# Patient Record
Sex: Female | Born: 1995 | Race: Black or African American | Hispanic: No | Marital: Single | Smoking: Former smoker
Health system: Southern US, Community
[De-identification: ages and names within clinical notes are randomized; demographics above are authoritative.]

## PROBLEM LIST (undated history)

## (undated) DIAGNOSIS — F32A Depression, unspecified: Secondary | ICD-10-CM

## (undated) DIAGNOSIS — R569 Unspecified convulsions: Secondary | ICD-10-CM

## (undated) DIAGNOSIS — F419 Anxiety disorder, unspecified: Secondary | ICD-10-CM

## (undated) HISTORY — DX: Unspecified convulsions: R56.9

## (undated) HISTORY — DX: Depression, unspecified: F32.A

## (undated) HISTORY — DX: Anxiety disorder, unspecified: F41.9

---
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
## 2016-08-24 ENCOUNTER — Emergency Department
Admission: EM | Admit: 2016-08-24 | Discharge: 2016-08-24 | Disposition: A | Discharge status: Home / Self Care | Payer: Self-pay | Attending: Emergency Medicine | Admitting: Emergency Medicine

## 2016-08-24 ENCOUNTER — Encounter: Payer: Self-pay | Admitting: Emergency Medicine

## 2016-08-24 DIAGNOSIS — J0100 Acute maxillary sinusitis, unspecified: Secondary | ICD-10-CM | POA: Insufficient documentation

## 2016-08-24 DIAGNOSIS — Z87891 Personal history of nicotine dependence: Secondary | ICD-10-CM | POA: Insufficient documentation

## 2016-08-24 MED ORDER — AMOXICILLIN 500 MG PO CAPS: 500.0000 mg | ORAL_CAPSULE | Freq: Three times a day (TID) | ORAL | 0 refills | Status: DC

## 2016-08-24 MED ORDER — FEXOFENADINE-PSEUDOEPHED ER 60-120 MG PO TB12: 1.0000 | ORAL_TABLET | Freq: Two times a day (BID) | ORAL | 0 refills | Status: DC

## 2016-08-24 NOTE — ED Notes (Signed)
Pt states that for the past 2 weeks she has had facial pain, blurred vision, headache, sinus pressure, and nasal congestion.

## 2016-08-24 NOTE — ED Notes (Signed)

## 2016-08-24 NOTE — ED Provider Notes (Signed)
[facility] Emergency Department Provider Note   ____________________________________________   First MD Initiated Contact with Patient 08/24/16 1637     (approximate)  I have reviewed the triage vital signs and the nursing notes.   HISTORY  Chief Complaint Facial Pain    HPI Julia Sullivan is a 21 y.o. female patient complains sinus congestion with drainage 2 weeks. Patient also states she's having facial pain, headache, and left ear pressure patient state 2 weeks ago she was diagnosed with the flu. No palliative measures for this complaint. Patient rates her pain discomfort as a 4/10. Patient described the pain as "pressure".   History reviewed. No pertinent past medical history.  There are no active problems to display for this patient.   History reviewed. No pertinent surgical history.  Prior to Admission medications   Medication Sig Start Date End Date Taking? Authorizing Provider  amoxicillin (AMOXIL) 500 MG capsule Take 1 capsule (500 mg total) by mouth 3 (three) times daily. 08/24/16   Joni Reining, PA-C  fexofenadine-pseudoephedrine (ALLEGRA-D) 60-120 MG 12 hr tablet Take 1 tablet by mouth 2 (two) times daily. 08/24/16   Joni Reining, PA-C    Allergies Patient has no known allergies.  No family history on file.  Social History Social History  Substance Use Topics  . Smoking status: Former Games developer  . Smokeless tobacco: Not on file  . Alcohol use Not on file    Review of Systems Constitutional: No fever/chills Eyes: No visual changes. ENT: No sore throat.Nasal congestion and ear Cardiovascular: Denies chest pain. Respiratory: Denies shortness of breath. Gastrointestinal: No abdominal pain.  No nausea, no vomiting.  No diarrhea.  No constipation. Genitourinary: Negative for dysuria. Musculoskeletal: Negative for back pain. Skin: Negative for rash. Neurological: Negative for headaches, focal weakness or  numbness.    ____________________________________________   PHYSICAL EXAM:  VITAL SIGNS: ED Triage Vitals  Enc Vitals Group     BP 08/24/16 1631 128/78     Pulse Rate 08/24/16 1631 62     Resp 08/24/16 1631 20     Temp 08/24/16 1631 97.9 F (36.6 C)     Temp Source 08/24/16 1631 Oral     SpO2 08/24/16 1631 100 %     Weight 08/24/16 1631 294 lb (133.4 kg)     Height 08/24/16 1631 5\' 6"  (1.676 m)     Head Circumference --      Peak Flow --      Pain Score 08/24/16 1632 4     Pain Loc --      Pain Edu? --      Excl. in GC? --     Constitutional: Alert and oriented. Well appearing and in no acute distress. Eyes: Conjunctivae are normal. PERRL. EOMI. Head: Atraumatic. Nose:Edematous nasal turbinates. Bilateral maxillary guarding.  Mouth/Throat: Mucous membranes are moist.  Oropharynx non-erythematous. Postnasal drainage. Neck: No stridor. No cervical spine tenderness to palpation. Hematological/Lymphatic/Immunilogical: No cervical lymphadenopathy. Cardiovascular: Normal rate, regular rhythm. Grossly normal heart sounds.  Good peripheral circulation. Respiratory: Normal respiratory effort.  No retractions. Lungs CTAB. Gastrointestinal: Soft and nontender. No distention. No abdominal bruits. No CVA tenderness. Musculoskeletal: No lower extremity tenderness nor edema.  No joint effusions. Neurologic:  Normal speech and language. No gross focal neurologic deficits are appreciated. No gait instability. Skin:  Skin is warm, dry and intact. No rash noted. Psychiatric: Mood and affect are normal. Speech and behavior are normal.  ____________________________________________   LABS (all labs ordered are  listed, but only abnormal results are displayed)  Labs Reviewed - No data to display ____________________________________________  EKG   ____________________________________________  RADIOLOGY   ____________________________________________   PROCEDURES  Procedure(s)  performed: None  Procedures  Critical Care performed: No  ____________________________________________   INITIAL IMPRESSION / ASSESSMENT AND PLAN / ED COURSE  Pertinent labs & imaging results that were available during my care of the patient were reviewed by me and considered in my medical decision making (see chart for details).  Maxillary sinusitis. Patient given discharge care instructions. Patient get a prescription for amoxicillin and fexofenadine pseudoephedrine. Patient advised follow "clinic if condition persists.      ____________________________________________   FINAL CLINICAL IMPRESSION(S) / ED DIAGNOSES  Final diagnoses:  Subacute maxillary sinusitis      NEW MEDICATIONS STARTED DURING THIS VISIT:  New Prescriptions   AMOXICILLIN (AMOXIL) 500 MG CAPSULE    Take 1 capsule (500 mg total) by mouth 3 (three) times daily.   FEXOFENADINE-PSEUDOEPHEDRINE (ALLEGRA-D) 60-120 MG 12 HR TABLET    Take 1 tablet by mouth 2 (two) times daily.     Note:  This document was prepared using Dragon voice recognition software and may include unintentional dictation errors.    Joni Reiningonald K Smith, PA-C 08/24/16 1651    Governor Rooksebecca Lord, MD 08/25/16 [phone]

## 2016-08-24 NOTE — ED Triage Notes (Signed)
Sinus congestion with clear drainage x 2 weeks.

## 2016-11-06 ENCOUNTER — Encounter: Payer: Self-pay | Admitting: Certified Nurse Midwife

## 2016-11-16 ENCOUNTER — Encounter: Payer: Self-pay | Admitting: Emergency Medicine

## 2016-11-16 ENCOUNTER — Emergency Department: Payer: Self-pay

## 2016-11-16 DIAGNOSIS — R0789 Other chest pain: Secondary | ICD-10-CM | POA: Insufficient documentation

## 2016-11-16 DIAGNOSIS — Z79899 Other long term (current) drug therapy: Secondary | ICD-10-CM | POA: Insufficient documentation

## 2016-11-16 DIAGNOSIS — K219 Gastro-esophageal reflux disease without esophagitis: Secondary | ICD-10-CM | POA: Insufficient documentation

## 2016-11-16 DIAGNOSIS — Z87891 Personal history of nicotine dependence: Secondary | ICD-10-CM | POA: Insufficient documentation

## 2016-11-16 LAB — TROPONIN I: Troponin I: <0.03 ng/mL (ref <0.03)

## 2016-11-16 LAB — CBC
HEMATOCRIT: 33.6 % — AB (ref 35.0–47.0)
HEMOGLOBIN: 11.6 g/dL — AB (ref 12.0–16.0)
MCH: 32.5 pg (ref 26.0–34.0)
MCHC: 34.6 g/dL (ref 32.0–36.0)
MCV: 94 fL (ref 80.0–100.0)
Platelets: 240 10*3/uL (ref 150–440)
RBC: 3.58 MIL/uL — ABNORMAL LOW (ref 3.80–5.20)
RDW: 13.1 % (ref 11.5–14.5)
WBC: 6.5 10*3/uL (ref 3.6–11.0)

## 2016-11-16 LAB — BASIC METABOLIC PANEL
ANION GAP: 7 (ref 5–15)
BUN: 11 mg/dL (ref 6–20)
CHLORIDE: 105 mmol/L (ref 101–111)
CO2: 25 mmol/L (ref 22–32)
Calcium: 8.8 mg/dL — ABNORMAL LOW (ref 8.9–10.3)
Creatinine, Ser: 0.86 mg/dL (ref 0.44–1.00)
GFR calc Af Amer: >60 mL/min (ref >60)
GFR calc non Af Amer: >60 mL/min (ref >60)
GLUCOSE: 90 mg/dL (ref 65–99)
POTASSIUM: 3.9 mmol/L (ref 3.5–5.1)
Sodium: 137 mmol/L (ref 135–145)

## 2016-11-16 NOTE — ED Triage Notes (Signed)
Pt ambulatory to triage in NAD, report upper CP x several days, reports palpitations w/ pain, denies other sx.  Denies cardiac hx or hx of risk factors.

## 2016-11-17 ENCOUNTER — Emergency Department
Admission: EM | Admit: 2016-11-17 | Discharge: 2016-11-17 | Disposition: A | Discharge status: Home / Self Care | Payer: Self-pay | Attending: Emergency Medicine | Admitting: Emergency Medicine

## 2016-11-17 DIAGNOSIS — R0789 Other chest pain: Secondary | ICD-10-CM

## 2016-11-17 DIAGNOSIS — K219 Gastro-esophageal reflux disease without esophagitis: Secondary | ICD-10-CM

## 2016-11-17 MED ORDER — FAMOTIDINE 40 MG PO TABS: 40.0000 mg | ORAL_TABLET | Freq: Every evening | ORAL | 0 refills | Status: DC

## 2016-11-17 NOTE — Discharge Instructions (Signed)
Please take your Pepcid every day as prescribed and follow up with her primary care physician within one to 2 weeks for recheck. Return to the emergency department sooner for any new or worsening symptoms such as worsening pain, if you cannot eat or drink, or for any other concerns whatsoever.  It was a pleasure to take care of you today, and thank you for coming to our emergency department.  If you have any questions or concerns before leaving please ask the nurse to grab me and I'm more than happy to go through your aftercare instructions again.  If you were prescribed any opioid pain medication today such as Norco, Vicodin, Percocet, morphine, hydrocodone, or oxycodone please make sure you do not drive when you are taking this medication as it can alter your ability to drive safely.  If you have any concerns once you are home that you are not improving or are in fact getting worse before you can make it to your follow-up appointment, please do not hesitate to call [phone] and come back for further evaluation.  Merrily BrittleNeil Memphis Creswell MD  Results for orders placed or performed during the hospital encounter of 11/17/16  Basic metabolic panel  Result Value Ref Range   Sodium 137 135 - 145 mmol/L   Potassium 3.9 3.5 - 5.1 mmol/L   Chloride 105 101 - 111 mmol/L   CO2 25 22 - 32 mmol/L   Glucose, Bld 90 65 - 99 mg/dL   BUN 11 6 - 20 mg/dL   Creatinine, Ser 4.090.86 0.44 - 1.00 mg/dL   Calcium 8.8 (L) 8.9 - 10.3 mg/dL   GFR calc non Af Amer >60 >60 mL/min   GFR calc Af Amer >60 >60 mL/min   Anion gap 7 5 - 15  CBC  Result Value Ref Range   WBC 6.5 3.6 - 11.0 K/uL   RBC 3.58 (L) 3.80 - 5.20 MIL/uL   Hemoglobin 11.6 (L) 12.0 - 16.0 g/dL   HCT 81.133.6 (L) 91.435.0 - 78.247.0 %   MCV 94.0 80.0 - 100.0 fL   MCH 32.5 26.0 - 34.0 pg   MCHC 34.6 32.0 - 36.0 g/dL   RDW 95.613.1 21.311.5 - 08.614.5 %   Platelets 240 150 - 440 K/uL  Troponin I  Result Value Ref Range   Troponin I <0.03 <0.03 ng/mL   Dg Chest 2 View  Result Date:  11/16/2016 CLINICAL DATA:  21 y/o  F; chest pain. EXAM: CHEST  2 VIEW COMPARISON:  None. FINDINGS: The heart size and mediastinal contours are within normal limits. Both lungs are clear. The visualized skeletal structures are unremarkable. IMPRESSION: No active cardiopulmonary disease. Electronically Signed   By: Mitzi HansenLance  Furusawa-Stratton M.D.   On: 11/16/2016 23:31

## 2016-11-17 NOTE — ED Provider Notes (Signed)
[facility] Emergency Department Provider Note  ____________________________________________   First MD Initiated Contact with Patient 11/17/16 0154     (approximate)  I have reviewed the triage vital signs and the nursing notes.   HISTORY  Chief Complaint Chest Pain    HPI Julia Sullivan is a 21 y.o. female who comes to the emergency department with 5 days of intermittent chest pain. The pain is described as slight burning and brief. It is worse in the morning and worse after eating. It is not exertional. It is not associated with shortness of breath. It is diffuse across her upper chest. She has no past medical history. No family history of early heart disease or sudden cardiac death. She denies abdominal pain. She does report nausea but denies vomiting.   History reviewed. No pertinent past medical history.  There are no active problems to display for this patient.   History reviewed. No pertinent surgical history.  Prior to Admission medications   Medication Sig Start Date End Date Taking? Authorizing Provider  amoxicillin (AMOXIL) 500 MG capsule Take 1 capsule (500 mg total) by mouth 3 (three) times daily. 08/24/16   Joni ReiningSmith, Ronald K, PA-C  famotidine (PEPCID) 40 MG tablet Take 1 tablet (40 mg total) by mouth every evening. 11/17/16 11/17/17  Merrily Brittleifenbark, Meredyth Hornung, MD  fexofenadine-pseudoephedrine (ALLEGRA-D) 60-120 MG 12 hr tablet Take 1 tablet by mouth 2 (two) times daily. 08/24/16   Joni ReiningSmith, Ronald K, PA-C    Allergies Patient has no known allergies.  History reviewed. No pertinent family history.  Social History Social History  Substance Use Topics  . Smoking status: Former Games developermoker  . Smokeless tobacco: Never Used  . Alcohol use Yes    Review of Systems Constitutional: No fever/chills Eyes: No visual changes. ENT: No sore throat. Cardiovascular: Positive chest pain. Respiratory: Denies shortness of breath. Gastrointestinal: No abdominal  pain.  Positive nausea, no vomiting.  No diarrhea.  No constipation. Genitourinary: Negative for dysuria. Musculoskeletal: Negative for back pain. Skin: Negative for rash. Neurological: Negative for headaches, focal weakness or numbness.  10-point ROS otherwise negative.  ____________________________________________   PHYSICAL EXAM:  VITAL SIGNS: ED Triage Vitals  Enc Vitals Group     BP 11/16/16 2241 (!) 153/90     Pulse Rate 11/16/16 2241 79     Resp 11/16/16 2241 16     Temp 11/16/16 2241 98.5 F (36.9 C)     Temp Source 11/16/16 2241 Oral     SpO2 11/16/16 2241 100 %     Weight 11/16/16 2242 297 lb (134.7 kg)     Height 11/16/16 2242 5\' 6"  (1.676 m)     Head Circumference --      Peak Flow --      Pain Score 11/16/16 2241 3     Pain Loc --      Pain Edu? --      Excl. in GC? --     Constitutional: Alert and oriented x 4 well appearing nontoxic no diaphoresis speaks in full, clear sentences Eyes: PERRL EOMI. Head: Atraumatic. Nose: No congestion/rhinnorhea. Mouth/Throat: No trismus Neck: No stridor.   Cardiovascular: Normal rate, regular rhythm. Grossly normal heart sounds.  Good peripheral circulation. Respiratory: Normal respiratory effort.  No retractions. Lungs CTAB and moving good air Gastrointestinal: Soft obese nontender no rebound no guarding no peritonitis Musculoskeletal: No lower extremity edema   Neurologic:  Normal speech and language. No gross focal neurologic deficits are appreciated. Skin:  Skin is warm,  dry and intact. No rash noted. Psychiatric: Mood and affect are normal. Speech and behavior are normal.    ____________________________________________   ______________________________________   LABS (all labs ordered are listed, but only abnormal results are displayed)  Labs Reviewed  BASIC METABOLIC PANEL - Abnormal; Notable for the following:       Result Value   Calcium 8.8 (*)    All other components within normal limits  CBC -  Abnormal; Notable for the following:    RBC 3.58 (*)    Hemoglobin 11.6 (*)    HCT 33.6 (*)    All other components within normal limits  TROPONIN I    No evidence of acute ischemia __________________________________________  EKG  ED ECG REPORT I, Merrily Brittle, the attending physician, personally viewed and interpreted this ECG.  Date: 11/17/2016 Rate: 74 Rhythm: normal sinus rhythm QRS Axis: normal Intervals: normal ST/T Wave abnormalities: normal Conduction Disturbances: none Narrative Interpretation: unremarkable  ____________________________________________  RADIOLOGY  Chest x-ray with no acute disease ____________________________________________   PROCEDURES  Procedure(s) performed: no  Procedures  Critical Care performed: no  Observation: no ____________________________________________   INITIAL IMPRESSION / ASSESSMENT AND PLAN / ED COURSE  Pertinent labs & imaging results that were available during my care of the patient were reviewed by me and considered in my medical decision making (see chart for details).  Postprandial nonexertional chest pain and a 21 year old woman is likely noncardiac. Doubt pulmonary embolism as she is PERC negative.  I will give a trial of Pepcid and the patient says that she has a primary care physician she can follow-up with in 2 weeks for recheck. Strict return to the ER precautions given. She is discharged home in good condition.      ____________________________________________   FINAL CLINICAL IMPRESSION(S) / ED DIAGNOSES  Final diagnoses:  Atypical chest pain  Gastroesophageal reflux disease without esophagitis      NEW MEDICATIONS STARTED DURING THIS VISIT:  New Prescriptions   FAMOTIDINE (PEPCID) 40 MG TABLET    Take 1 tablet (40 mg total) by mouth every evening.     Note:  This document was prepared using Dragon voice recognition software and may include unintentional dictation errors.       Merrily Brittle, MD 11/17/16 [phone]

## 2016-12-12 ENCOUNTER — Encounter: Payer: Self-pay | Admitting: Certified Nurse Midwife

## 2018-07-01 IMAGING — CR DG CHEST 2V
3 series · 3 of 3 positions shown · non-contrast
Comparison: None.

CLINICAL DATA: 21 y/o  F; chest pain.

EXAM:
CHEST  2 VIEW

[chest pa (1 of 2)]
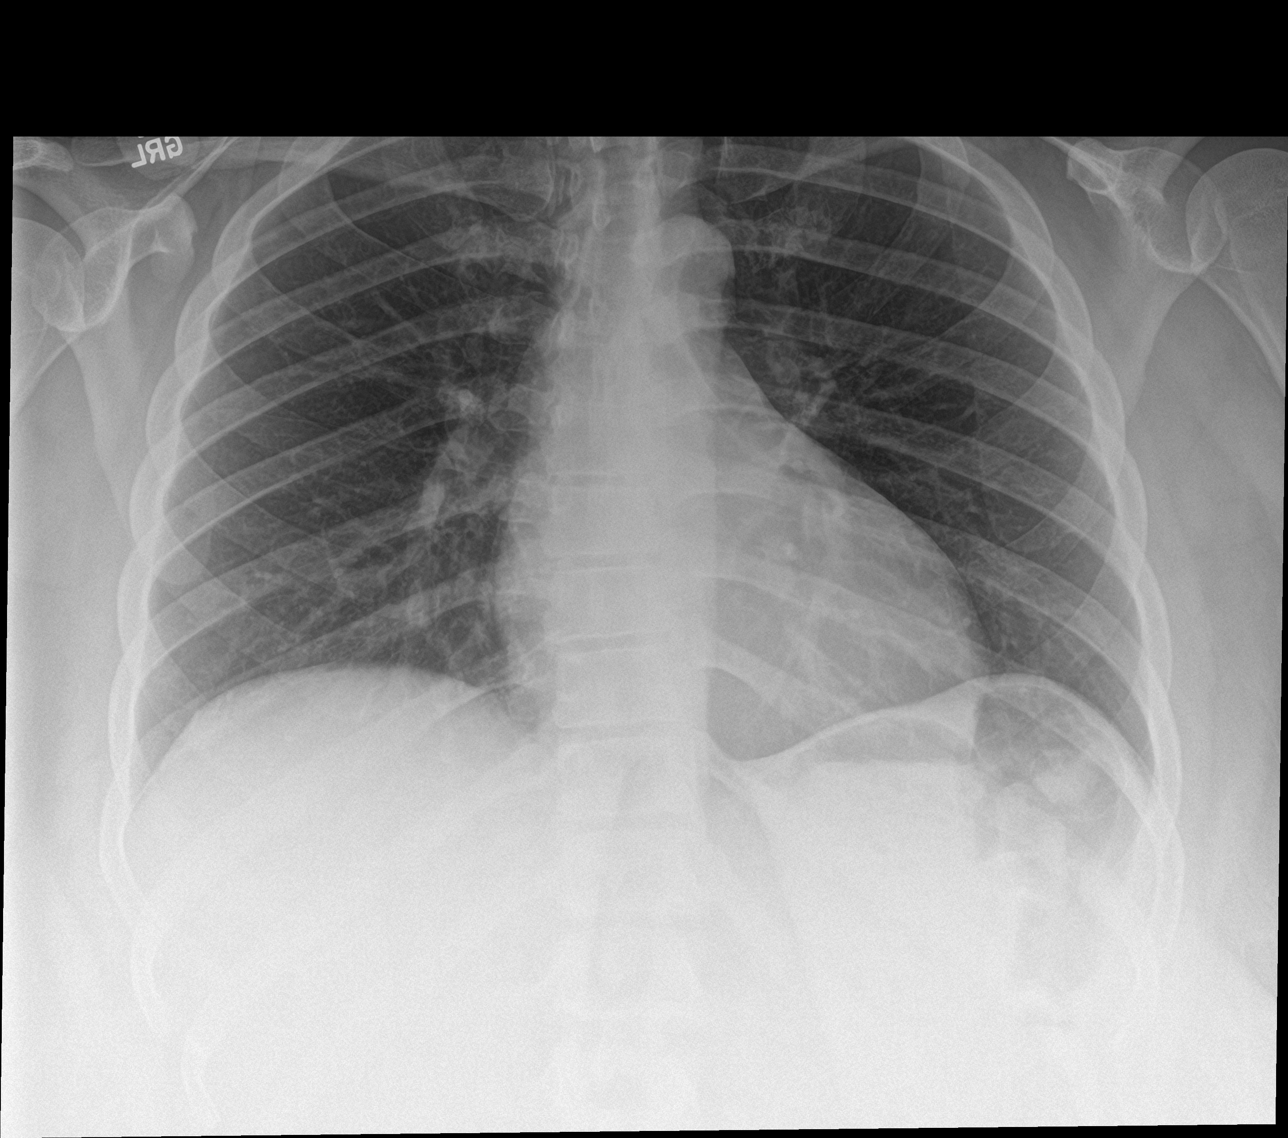

[chest lat]
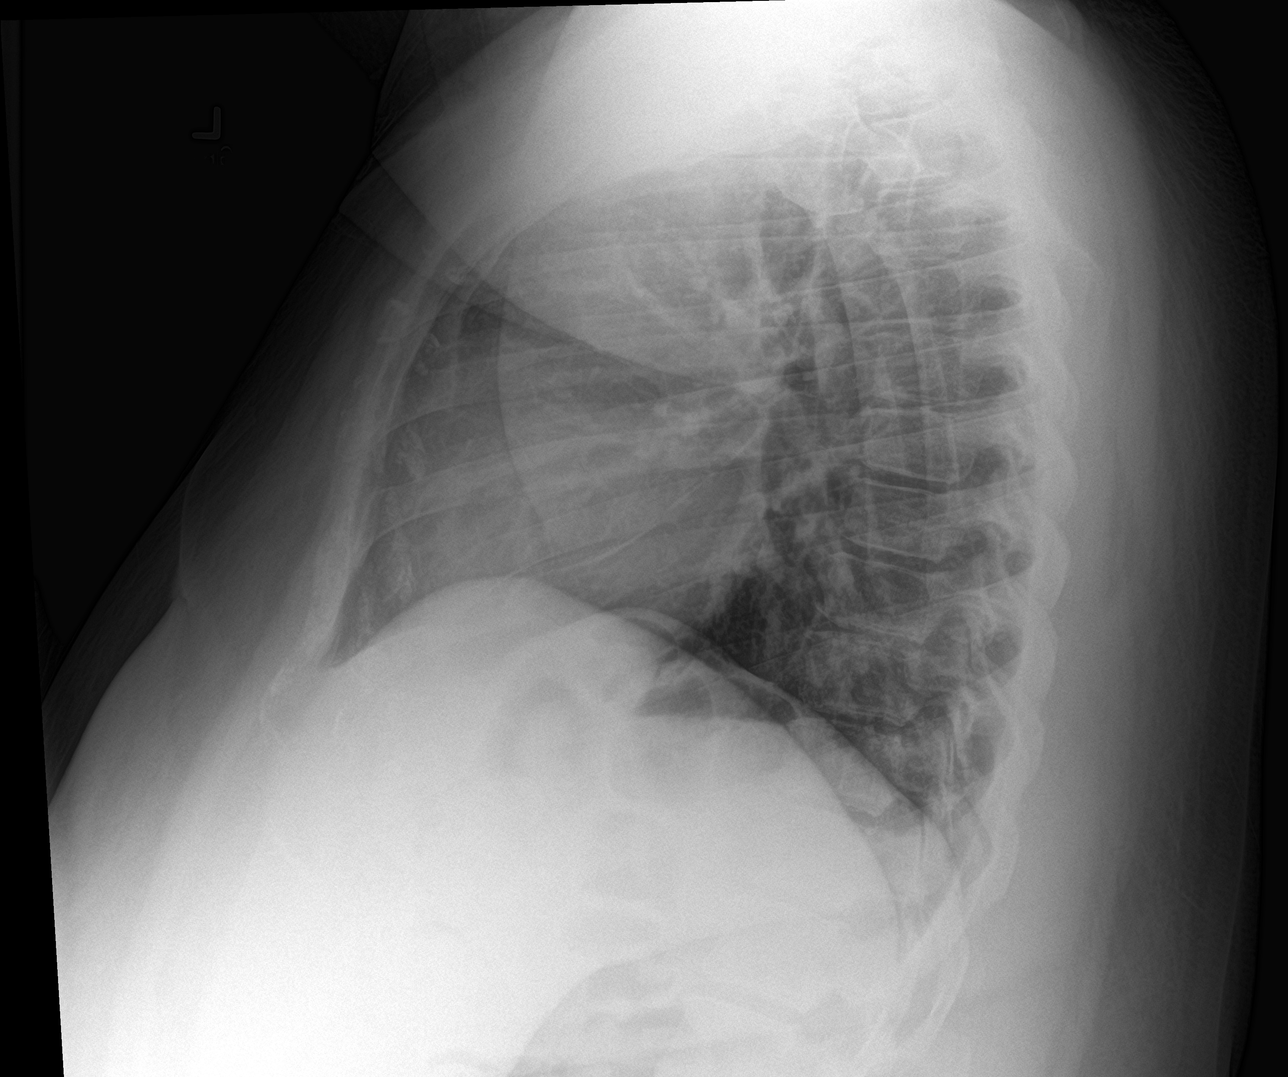

[chest pa (2 of 2)]
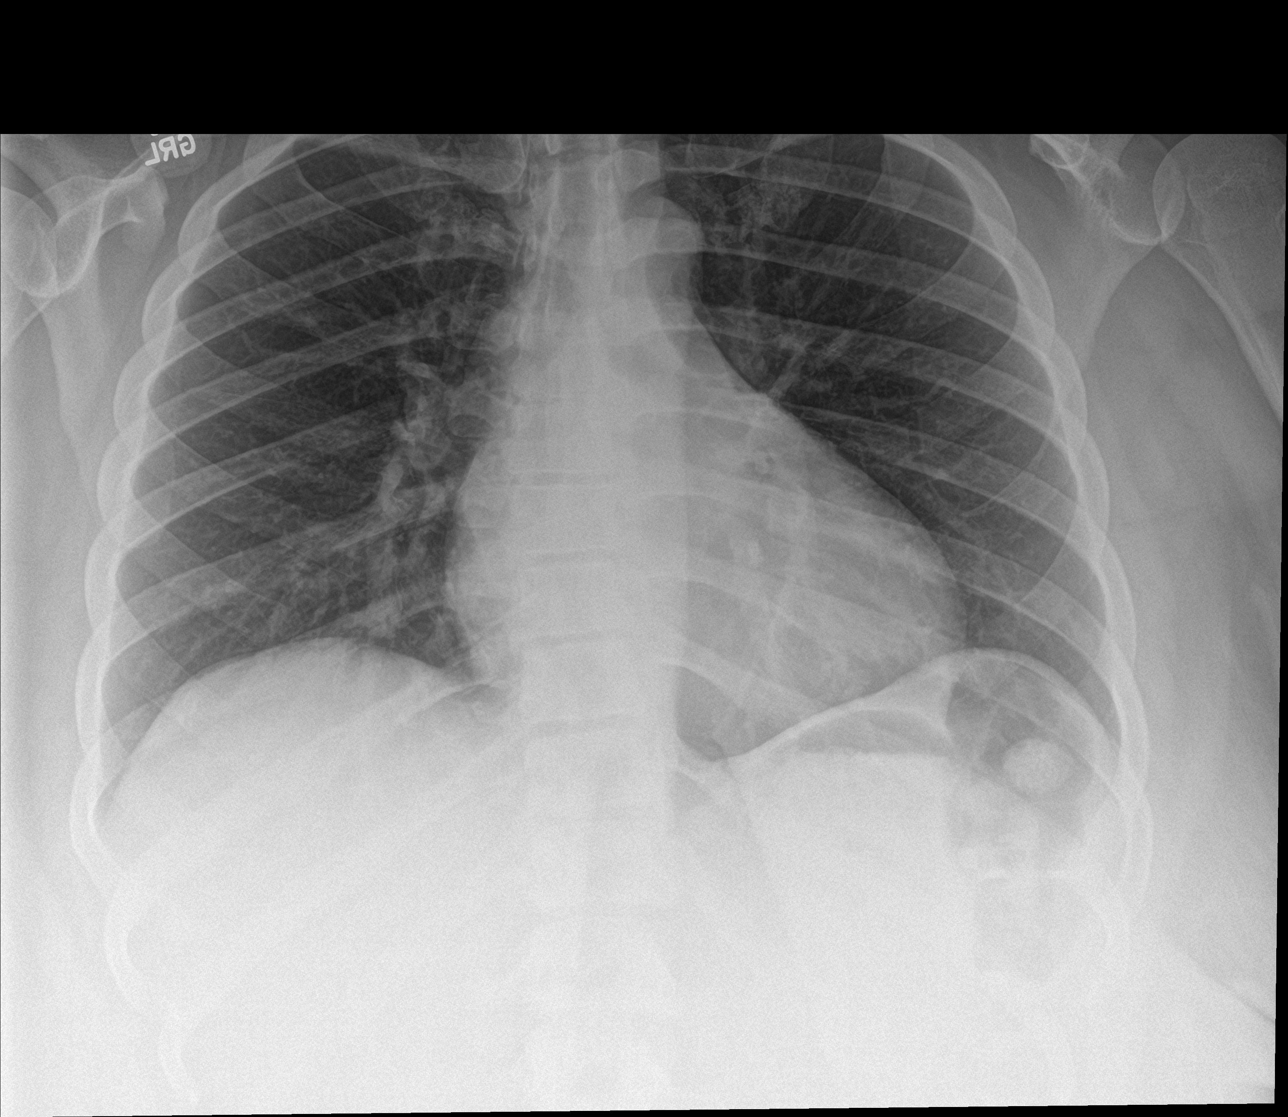

[3 of 3 positions shown; findings below may reference images not displayed]

FINDINGS: The heart size and mediastinal contours are within normal limits.
Both lungs are clear. The visualized skeletal structures are
unremarkable.
IMPRESSION: No active cardiopulmonary disease.

By: Gisty Wolde M.D.

## 2020-10-10 DIAGNOSIS — H5213 Myopia, bilateral: Secondary | ICD-10-CM | POA: Diagnosis not present

## 2020-11-10 ENCOUNTER — Emergency Department
Admission: EM | Admit: 2020-11-10 | Discharge: 2020-11-10 | Disposition: A | Discharge status: Home / Self Care | Payer: BC Managed Care – PPO | Attending: Emergency Medicine | Admitting: Emergency Medicine

## 2020-11-10 ENCOUNTER — Other Ambulatory Visit: Payer: Self-pay

## 2020-11-10 ENCOUNTER — Encounter: Payer: Self-pay | Admitting: Emergency Medicine

## 2020-11-10 ENCOUNTER — Emergency Department: Payer: BC Managed Care – PPO

## 2020-11-10 DIAGNOSIS — R079 Chest pain, unspecified: Secondary | ICD-10-CM | POA: Diagnosis not present

## 2020-11-10 DIAGNOSIS — Z87891 Personal history of nicotine dependence: Secondary | ICD-10-CM | POA: Insufficient documentation

## 2020-11-10 DIAGNOSIS — R0789 Other chest pain: Secondary | ICD-10-CM | POA: Insufficient documentation

## 2020-11-10 NOTE — ED Triage Notes (Signed)
Pt comes into the ED via POV c/o central chest pain with no radiation.  Pt denies any cardiac history and denies any N/V, dizziness, or SHOB.  Pt has even and unlabored respirations at this time.  Pt explains that the chest "spasms" woke her up from being asleep.  PT ambulatory to triage.

## 2020-11-10 NOTE — ED Notes (Signed)
Patient transported to X-ray 

## 2020-11-10 NOTE — ED Provider Notes (Signed)
[facility] Emergency Department Provider Note   ____________________________________________    I have reviewed the triage vital signs and the nursing notes.   HISTORY  Chief Complaint Chest Pain     HPI Julia Sullivan is a 25 y.o. female who presents with reports of a spasming sensation in her chest while she was sleeping.  She reports this woke her up.  She describes it as muscle spasm sensation in her anterior chest wall, now resolved.  No pressure pain, no diaphoresis, no radiation.  No nausea vomiting.  No fevers chills or shortness of breath.  No history of heart disease  History reviewed. No pertinent past medical history.  There are no problems to display for this patient.   History reviewed. No pertinent surgical history.  Prior to Admission medications   Medication Sig Start Date End Date Taking? Authorizing Provider  amoxicillin (AMOXIL) 500 MG capsule Take 1 capsule (500 mg total) by mouth 3 (three) times daily. 08/24/16   Joni Reining, PA-C  famotidine (PEPCID) 40 MG tablet Take 1 tablet (40 mg total) by mouth every evening. 11/17/16 11/17/17  Merrily Brittle, MD  fexofenadine-pseudoephedrine (ALLEGRA-D) 60-120 MG 12 hr tablet Take 1 tablet by mouth 2 (two) times daily. 08/24/16   Joni Reining, PA-C     Allergies Patient has no known allergies.  History reviewed. No pertinent family history.  Social History Social History   Tobacco Use  . Smoking status: Former Games developer  . Smokeless tobacco: Never Used  Substance Use Topics  . Alcohol use: Yes    Review of Systems  Constitutional: No fever/chills Eyes: No visual changes.  ENT: No sore throat. Cardiovascular: As above Respiratory: Denies shortness of breath. Gastrointestinal: No abdominal pain.  No nausea, no vomiting.   Genitourinary: Negative for dysuria. Musculoskeletal: Negative for back pain. Skin: Negative for rash. Neurological: Negative for headaches or  weakness   ____________________________________________   PHYSICAL EXAM:  VITAL SIGNS: ED Triage Vitals  Enc Vitals Group     BP 11/10/20 1322 (!) 143/96     Pulse Rate 11/10/20 1322 63     Resp 11/10/20 1322 17     Temp 11/10/20 1322 98.2 F (36.8 C)     Temp Source 11/10/20 1322 Oral     SpO2 11/10/20 1322 100 %     Weight 11/10/20 1323 127 kg (280 lb)     Height 11/10/20 1323 1.676 m (5\' 6" )     Head Circumference --      Peak Flow --      Pain Score 11/10/20 1323 6     Pain Loc --      Pain Edu? --      Excl. in GC? --     Constitutional: Alert and oriented. No acute distress. Pleasant and interactive  Nose: No congestion/rhinnorhea. Mouth/Throat: Mucous membranes are moist.    Cardiovascular: Normal rate, regular rhythm. Grossly normal heart sounds.  Good peripheral circulation.  Mild tenderness along the right mid sternum but no rash or bruising Respiratory: Normal respiratory effort.  No retractions. Lungs CTAB. Gastrointestinal: Soft and nontender. No distention.  No CVA tenderness.  Musculoskeletal: No lower extremity tenderness nor edema.  Warm and well perfused Neurologic:  Normal speech and language. No gross focal neurologic deficits are appreciated.  Skin:  Skin is warm, dry and intact. No rash noted. Psychiatric: Mood and affect are normal. Speech and behavior are normal.  ____________________________________________   LABS (all labs ordered are  listed, but only abnormal results are displayed)  Labs Reviewed - No data to display ____________________________________________  EKG  ED ECG REPORT I, Jene Every, the attending physician, personally viewed and interpreted this ECG.  Date: 11/10/2020  Rhythm: normal sinus rhythm QRS Axis: normal Intervals: normal ST/T Wave abnormalities: normal Narrative Interpretation: no evidence of acute ischemia  ____________________________________________  RADIOLOGY  Chest x-ray reviewed by me, no  acute abnormality ____________________________________________   PROCEDURES  Procedure(s) performed: No  Procedures   Critical Care performed: No ____________________________________________   INITIAL IMPRESSION / ASSESSMENT AND PLAN / ED COURSE  Pertinent labs & imaging results that were available during my care of the patient were reviewed by me and considered in my medical decision making (see chart for details).  Patient well-appearing and asymptomatic here in the emergency department.  She is very low risk for ACS.  Her EKG is reassuring.  Chest x-ray is unremarkable.  She is asymptomatic at this time, her description is consistent with muscle spasm, recommend supportive care, outpatient follow-up as needed.  Return precautions discussed    ____________________________________________   FINAL CLINICAL IMPRESSION(S) / ED DIAGNOSES  Final diagnoses:  Chest wall pain        Note:  This document was prepared using Dragon voice recognition software and may include unintentional dictation errors.   Jene Every, MD 11/10/20 [phone]

## 2020-11-21 DIAGNOSIS — Z113 Encounter for screening for infections with a predominantly sexual mode of transmission: Secondary | ICD-10-CM | POA: Diagnosis not present

## 2020-11-21 DIAGNOSIS — Z114 Encounter for screening for human immunodeficiency virus [HIV]: Secondary | ICD-10-CM | POA: Diagnosis not present

## 2020-11-21 DIAGNOSIS — Z01419 Encounter for gynecological examination (general) (routine) without abnormal findings: Secondary | ICD-10-CM | POA: Diagnosis not present

## 2020-12-22 ENCOUNTER — Telehealth: Payer: Self-pay

## 2020-12-22 NOTE — Telephone Encounter (Signed)
We have received records from [facility] [place]. I attempt to reach patient to schedule office visit. Phone on file not accepting calls at this time.

## 2020-12-25 NOTE — Telephone Encounter (Signed)
Phone on file not accepting calls at this time.

## 2021-09-19 DIAGNOSIS — M791 Myalgia, unspecified site: Secondary | ICD-10-CM | POA: Diagnosis not present

## 2021-09-19 DIAGNOSIS — R1031 Right lower quadrant pain: Secondary | ICD-10-CM | POA: Diagnosis not present

## 2021-09-19 DIAGNOSIS — Z3202 Encounter for pregnancy test, result negative: Secondary | ICD-10-CM | POA: Diagnosis not present

## 2021-09-19 DIAGNOSIS — Z113 Encounter for screening for infections with a predominantly sexual mode of transmission: Secondary | ICD-10-CM | POA: Diagnosis not present

## 2022-02-25 DIAGNOSIS — Z021 Encounter for pre-employment examination: Secondary | ICD-10-CM | POA: Diagnosis not present

## 2022-02-26 DIAGNOSIS — H5213 Myopia, bilateral: Secondary | ICD-10-CM | POA: Diagnosis not present

## 2022-03-05 DIAGNOSIS — Z111 Encounter for screening for respiratory tuberculosis: Secondary | ICD-10-CM | POA: Diagnosis not present

## 2022-03-20 DIAGNOSIS — F411 Generalized anxiety disorder: Secondary | ICD-10-CM | POA: Diagnosis not present

## 2022-03-20 DIAGNOSIS — F331 Major depressive disorder, recurrent, moderate: Secondary | ICD-10-CM | POA: Diagnosis not present

## 2022-03-20 DIAGNOSIS — F4011 Social phobia, generalized: Secondary | ICD-10-CM | POA: Diagnosis not present

## 2022-04-04 ENCOUNTER — Other Ambulatory Visit: Payer: Self-pay

## 2022-04-04 ENCOUNTER — Encounter: Payer: Self-pay | Admitting: Nurse Practitioner

## 2022-04-04 ENCOUNTER — Ambulatory Visit: Payer: BC Managed Care – PPO | Admitting: Nurse Practitioner

## 2022-04-04 VITALS — BP 124/72 | HR 89 | Temp 98.2°F | Resp 18 | Ht 66.0 in | Wt 308.3 lb

## 2022-04-04 DIAGNOSIS — Z6841 Body Mass Index (BMI) 40.0 and over, adult: Secondary | ICD-10-CM

## 2022-04-04 DIAGNOSIS — Z130 Encounter for screening for diseases of the blood and blood-forming organs and certain disorders involving the immune mechanism: Secondary | ICD-10-CM | POA: Diagnosis not present

## 2022-04-04 DIAGNOSIS — Z131 Encounter for screening for diabetes mellitus: Secondary | ICD-10-CM | POA: Diagnosis not present

## 2022-04-04 DIAGNOSIS — Z13220 Encounter for screening for lipoid disorders: Secondary | ICD-10-CM | POA: Diagnosis not present

## 2022-04-04 DIAGNOSIS — Z7689 Persons encountering health services in other specified circumstances: Secondary | ICD-10-CM | POA: Diagnosis not present

## 2022-04-04 DIAGNOSIS — Z114 Encounter for screening for human immunodeficiency virus [HIV]: Secondary | ICD-10-CM

## 2022-04-04 DIAGNOSIS — Z1159 Encounter for screening for other viral diseases: Secondary | ICD-10-CM

## 2022-04-04 NOTE — Progress Notes (Signed)
BP 124/72   Pulse 89   Temp 98.2 F (36.8 C) (Oral)   Resp 18   Ht 5\' 6"  (1.676 m)   Wt (!) 308 lb 4.8 oz (139.8 kg)   LMP 03/15/2022   SpO2 99%   BMI 49.76 kg/m    Subjective:    Patient ID: 05/15/2022, female    DOB: [birth year], 26 y.o.   MRN: [id number]  HPI: Julia Sullivan is a 26 y.o. female  Chief Complaint  Patient presents with   Establish Care   Establish care:  Her last physical was 03/04/2022.  Her last pap last June, normal.  She denies any medical problems.  She does not take any medications.  Family history: mom prediabetic, htn,hyperlipidemia, Dad diabetic , htn hyperlipidemia, sister has ovarian cancer stage 4, htn, diabetes. LMC: 03/15/2022, normal and regular.   Obesity: Her current weight is 308 lbs with a BMI of 49.76.  She says she eats a well balanced diet. She goes to the gym 3-4 times a week.  She does a lot of walking.  Her highest weight was 403 lbs. She has tried intermittent fasting, working out. She has never tried medications.  Discussed weight loss options.  She would like to trial phentermine.  Discussed side effects and administration.  Will get labs first if normal will send in prescription tomorrow.   Relevant past medical, surgical, family and social history reviewed and updated as indicated. Interim medical history since our last visit reviewed. Allergies and medications reviewed and updated.  Review of Systems  Constitutional: Negative for fever or weight change.  Respiratory: Negative for cough and shortness of breath.   Cardiovascular: Negative for chest pain or palpitations.  Gastrointestinal: Negative for abdominal pain, no bowel changes.  Musculoskeletal: Negative for gait problem or joint swelling.  Skin: Negative for rash.  Neurological: Negative for dizziness or headache.  No other specific complaints in a complete review of systems (except as listed in HPI above).      Objective:    BP 124/72   Pulse 89   Temp 98.2 F  (36.8 C) (Oral)   Resp 18   Ht 5\' 6"  (1.676 m)   Wt (!) 308 lb 4.8 oz (139.8 kg)   LMP 03/15/2022   SpO2 99%   BMI 49.76 kg/m   Wt Readings from Last 3 Encounters:  04/04/22 (!) 308 lb 4.8 oz (139.8 kg)  11/10/20 280 lb (127 kg)  11/16/16 297 lb (134.7 kg)    Physical Exam  Constitutional: Patient appears well-developed and well-nourished. Obese  No distress.  HEENT: head atraumatic, normocephalic, pupils equal and reactive to light,  neck supple Cardiovascular: Normal rate, regular rhythm and normal heart sounds.  No murmur heard. No BLE edema. Pulmonary/Chest: Effort normal and breath sounds normal. No respiratory distress. Abdominal: Soft.  There is no tenderness. Psychiatric: Patient has a normal mood and affect. behavior is normal. Judgment and thought content normal.  Results for orders placed or performed during the hospital encounter of 11/17/16  Basic metabolic panel  Result Value Ref Range   Sodium 137 135 - 145 mmol/L   Potassium 3.9 3.5 - 5.1 mmol/L   Chloride 105 101 - 111 mmol/L   CO2 25 22 - 32 mmol/L   Glucose, Bld 90 65 - 99 mg/dL   BUN 11 6 - 20 mg/dL   Creatinine, Ser 01/16/17 0.44 - 1.00 mg/dL   Calcium 8.8 (L) 8.9 - 10.3 mg/dL   GFR calc non  Af Amer >60 >60 mL/min   GFR calc Af Amer >60 >60 mL/min   Anion gap 7 5 - 15  CBC  Result Value Ref Range   WBC 6.5 3.6 - 11.0 K/uL   RBC 3.58 (L) 3.80 - 5.20 MIL/uL   Hemoglobin 11.6 (L) 12.0 - 16.0 g/dL   HCT 33.6 (L) 35.0 - 47.0 %   MCV 94.0 80.0 - 100.0 fL   MCH 32.5 26.0 - 34.0 pg   MCHC 34.6 32.0 - 36.0 g/dL   RDW 13.1 11.5 - 14.5 %   Platelets 240 150 - 440 K/uL  Troponin I  Result Value Ref Range   Troponin I <0.03 <0.03 ng/mL      Assessment & Plan:   Problem List Items Addressed This Visit       Other   Class 3 severe obesity due to excess calories without serious comorbidity with body mass index (BMI) of 45.0 to 49.9 in adult [facility]) - Primary    Getting labs, will send in phentermine tomorrow  if appropriate.  Patient will continue to work on life style modification.        Relevant Orders   Lipid panel   CBC with Differential/Platelet   COMPLETE METABOLIC PANEL WITH GFR   Hemoglobin A1c   TSH   Other Visit Diagnoses     Encounter to establish care       not due for cpe, due next year   Screening for diabetes mellitus       Relevant Orders   COMPLETE METABOLIC PANEL WITH GFR   Hemoglobin A1c   Screening for cholesterol level       Relevant Orders   Lipid panel   Screening for deficiency anemia       Relevant Orders   CBC with Differential/Platelet   Screening for HIV without presence of risk factors       Relevant Orders   HIV Antibody (routine testing w rflx)   Encounter for hepatitis C screening test for low risk patient       Relevant Orders   Hepatitis C antibody        Follow up plan: Return in about 3 months (around 07/04/2022) for follow up.

## 2022-04-04 NOTE — Assessment & Plan Note (Signed)
Getting labs, will send in phentermine tomorrow if appropriate.  Patient will continue to work on life style modification.

## 2022-04-05 ENCOUNTER — Other Ambulatory Visit: Payer: Self-pay | Admitting: Nurse Practitioner

## 2022-04-05 ENCOUNTER — Telehealth: Payer: Self-pay | Admitting: Nurse Practitioner

## 2022-04-05 LAB — CBC WITH DIFFERENTIAL/PLATELET
Absolute Monocytes: 328 cells/uL (ref 200–950)
Basophils Absolute: 29 cells/uL (ref 0–200)
Basophils Relative: 0.7 %
Eosinophils Absolute: 109 cells/uL (ref 15–500)
Eosinophils Relative: 2.6 %
HCT: 37 % (ref 35.0–45.0)
Hemoglobin: 12.2 g/dL (ref 11.7–15.5)
Lymphs Abs: 2453 cells/uL (ref 850–3900)
MCH: 31.3 pg (ref 27.0–33.0)
MCHC: 33 g/dL (ref 32.0–36.0)
MCV: 94.9 fL (ref 80.0–100.0)
MPV: 11.3 fL (ref 7.5–12.5)
Monocytes Relative: 7.8 %
Neutro Abs: 1281 cells/uL — ABNORMAL LOW (ref 1500–7800)
Neutrophils Relative %: 30.5 %
Platelets: 290 10*3/uL (ref 140–400)
RBC: 3.9 10*6/uL (ref 3.80–5.10)
RDW: 12 % (ref 11.0–15.0)
Total Lymphocyte: 58.4 %
WBC: 4.2 10*3/uL (ref 3.8–10.8)

## 2022-04-05 LAB — TSH: TSH: 1.2 mIU/L

## 2022-04-05 LAB — COMPLETE METABOLIC PANEL WITH GFR
AG Ratio: 1.3 (calc) (ref 1.0–2.5)
ALT: 17 U/L (ref 6–29)
AST: 16 U/L (ref 10–30)
Albumin: 4 g/dL (ref 3.6–5.1)
Alkaline phosphatase (APISO): 14 U/L — ABNORMAL LOW (ref 31–125)
BUN: 9 mg/dL (ref 7–25)
CO2: 27 mmol/L (ref 20–32)
Calcium: 9.4 mg/dL (ref 8.6–10.2)
Chloride: 105 mmol/L (ref 98–110)
Creat: 0.84 mg/dL (ref 0.50–0.96)
Globulin: 3.1 g/dL (calc) (ref 1.9–3.7)
Glucose, Bld: 84 mg/dL (ref 65–99)
Potassium: 4.4 mmol/L (ref 3.5–5.3)
Sodium: 139 mmol/L (ref 135–146)
Total Bilirubin: 0.6 mg/dL (ref 0.2–1.2)
Total Protein: 7.1 g/dL (ref 6.1–8.1)
eGFR: 98 mL/min/{1.73_m2} (ref >=60)

## 2022-04-05 LAB — HEMOGLOBIN A1C
Hgb A1c MFr Bld: 5.3 % of total Hgb (ref <5.7)
Mean Plasma Glucose: 105 mg/dL
eAG (mmol/L): 5.8 mmol/L

## 2022-04-05 LAB — LIPID PANEL
Cholesterol: 241 mg/dL — ABNORMAL HIGH (ref <200)
HDL: 91 mg/dL (ref >=50)
LDL Cholesterol (Calc): 133 mg/dL (calc) — ABNORMAL HIGH
Non-HDL Cholesterol (Calc): 150 mg/dL (calc) — ABNORMAL HIGH (ref <130)
Total CHOL/HDL Ratio: 2.6 (calc) (ref <5.0)
Triglycerides: 80 mg/dL (ref <150)

## 2022-04-05 LAB — HIV ANTIBODY (ROUTINE TESTING W REFLEX): HIV 1&2 Ab, 4th Generation: NONREACTIVE

## 2022-04-05 LAB — HEPATITIS C ANTIBODY: Hepatitis C Ab: NONREACTIVE

## 2022-04-05 MED ORDER — PHENTERMINE HCL 37.5 MG PO TABS: 37.5000 mg | ORAL_TABLET | Freq: Every day | ORAL | 2 refills | Status: DC

## 2022-04-05 NOTE — Telephone Encounter (Unsigned)
Copied from [place] [id number]. Topic: General - Other >> Apr 05, 2022  2:54 PM Oley Balm E wrote: Reason for CRM: Pt called to report that she discussed weight loss medication with her PCP yesterday however nothing has been called in please advise   [place] (N), [place] [address] [place] ([place]) [place] [address] Phone: [phone] Fax: [phone]

## 2022-04-05 NOTE — Telephone Encounter (Signed)
Were you planning to call her some medication in

## 2022-06-25 IMAGING — CR DG CHEST 2V
1 series · 2 of 2 positions shown · non-contrast
Comparison: 11/16/2016

CLINICAL DATA: Chest pain

EXAM:
CHEST - 2 VIEW

[Series 1: dg chest 2 view · 0.14mm/px · 2 of 2 slices shown]
[im 1/2]
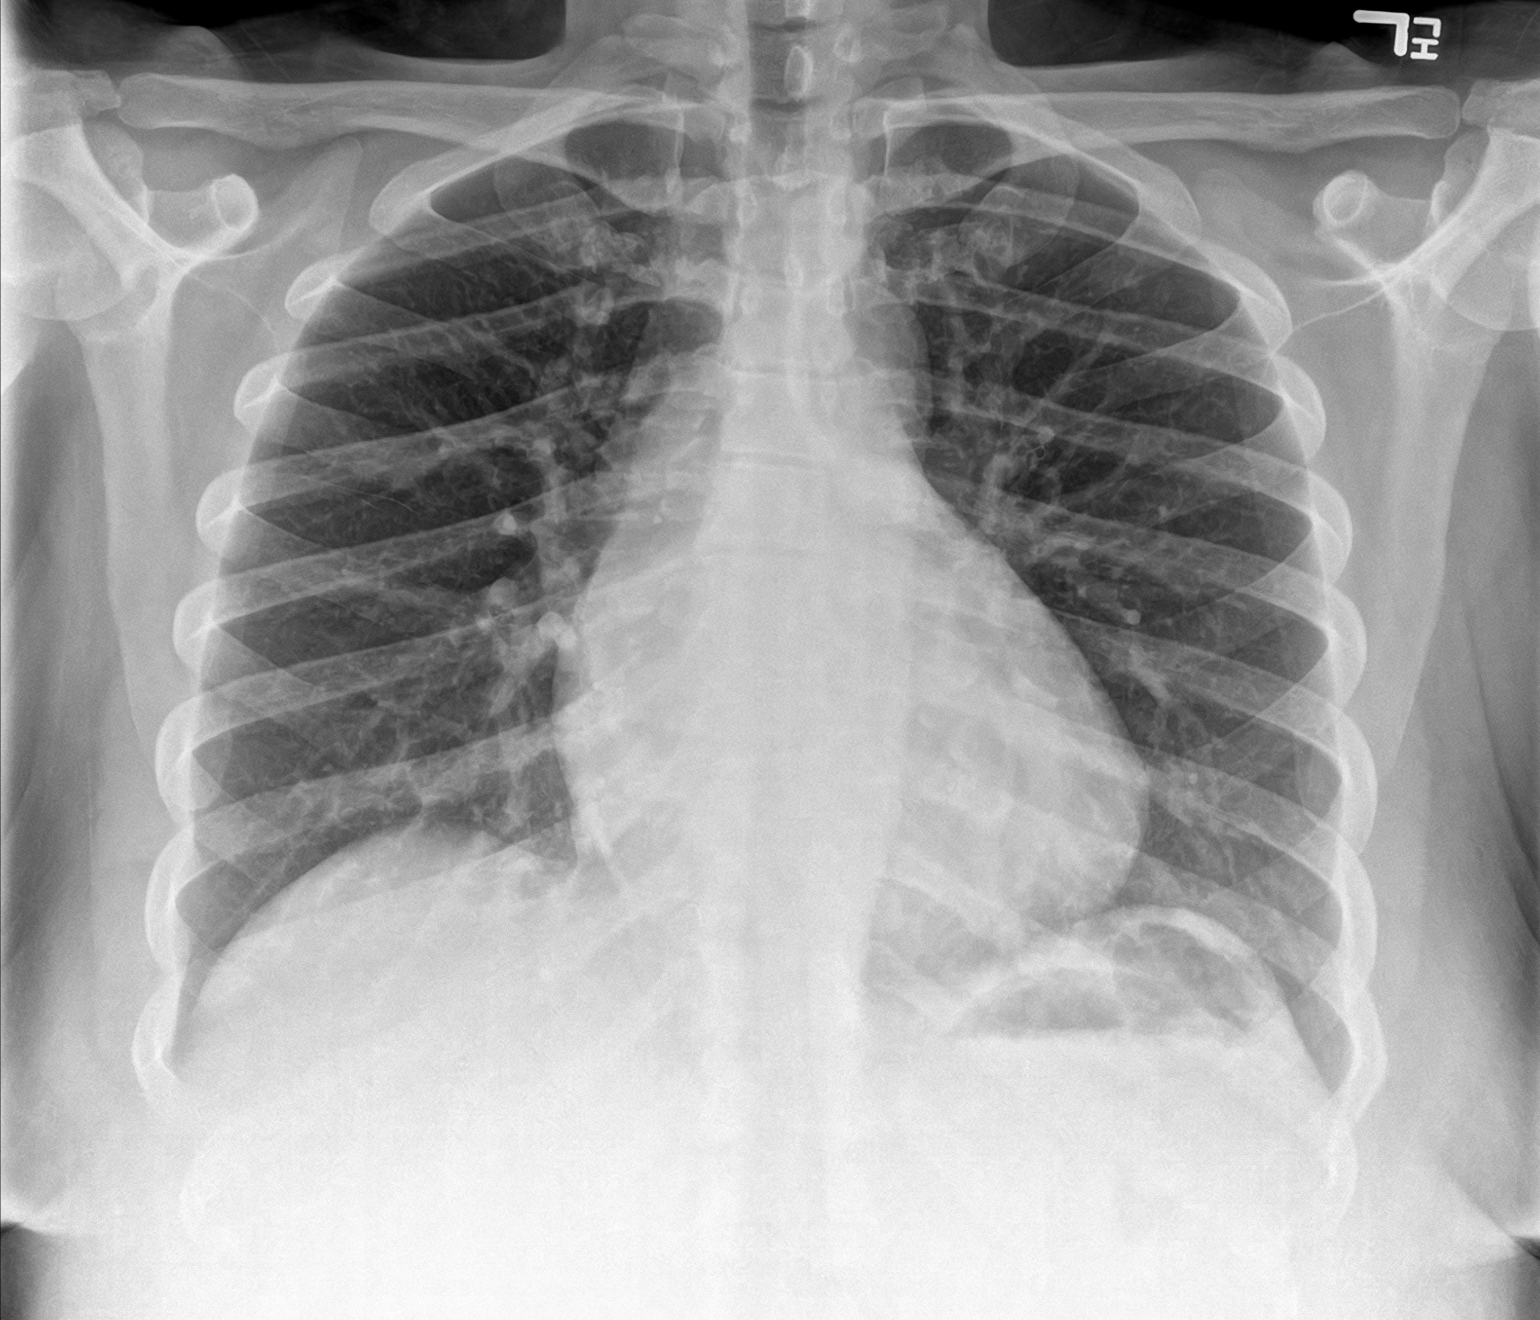
[im 2/2]
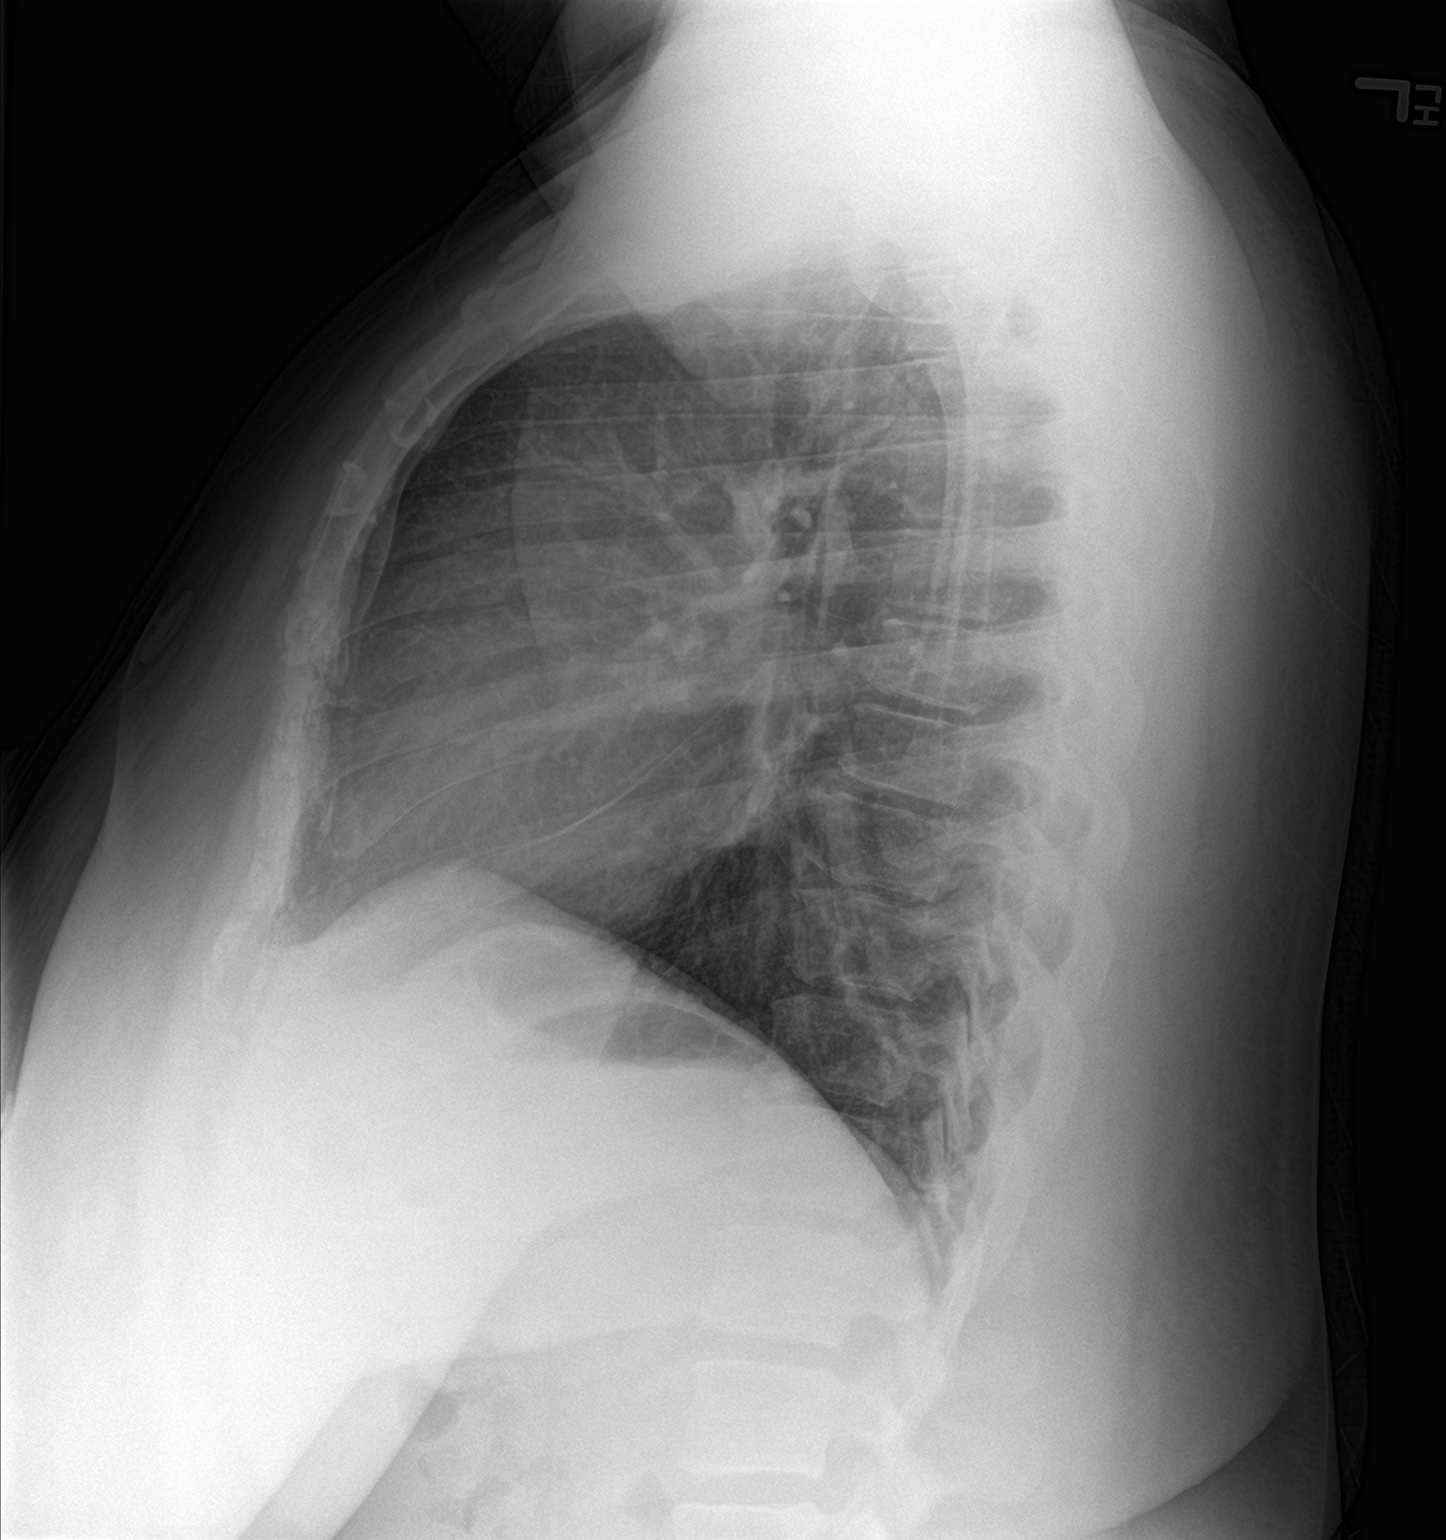

[2 of 2 positions shown; findings below may reference images not displayed]

FINDINGS: The heart size and mediastinal contours are within normal limits.
Both lungs are clear. The visualized skeletal structures are
unremarkable.
IMPRESSION: No acute abnormality of the lungs.

## 2022-07-04 ENCOUNTER — Ambulatory Visit: Payer: BC Managed Care – PPO | Admitting: Nurse Practitioner

## 2022-07-22 ENCOUNTER — Ambulatory Visit: Payer: BC Managed Care – PPO | Admitting: Nurse Practitioner

## 2022-07-25 ENCOUNTER — Other Ambulatory Visit: Payer: Self-pay

## 2022-07-25 ENCOUNTER — Ambulatory Visit: Payer: BC Managed Care – PPO | Admitting: Nurse Practitioner

## 2022-07-25 ENCOUNTER — Encounter: Payer: Self-pay | Admitting: Nurse Practitioner

## 2022-07-25 VITALS — BP 124/82 | HR 80 | Temp 98.2°F | Resp 18 | Ht 66.0 in | Wt 311.4 lb

## 2022-07-25 DIAGNOSIS — F419 Anxiety disorder, unspecified: Secondary | ICD-10-CM | POA: Diagnosis not present

## 2022-07-25 DIAGNOSIS — F330 Major depressive disorder, recurrent, mild: Secondary | ICD-10-CM | POA: Insufficient documentation

## 2022-07-25 DIAGNOSIS — Z6841 Body Mass Index (BMI) 40.0 and over, adult: Secondary | ICD-10-CM | POA: Diagnosis not present

## 2022-07-25 MED ORDER — SEMAGLUTIDE-WEIGHT MANAGEMENT 0.25 MG/0.5ML ~~LOC~~ SOAJ: 0.2500 mg | SUBCUTANEOUS | 0 refills | Status: DC

## 2022-07-25 NOTE — Progress Notes (Signed)
BP 124/82   Pulse 80   Temp 98.2 F (36.8 C) (Oral)   Resp 18   Ht 5\' 6"  (1.676 m)   Wt (!) 311 lb 6.4 oz (141.3 kg)   LMP 07/21/2022   SpO2 99%   BMI 50.26 kg/m    Subjective:    Patient ID: 07/23/2022, female    DOB: 08-30-1995, 27 y.o.   MRN: [id number]  HPI: Julia Sullivan is a 27 y.o. female  Chief Complaint  Patient presents with   Obesity    Follow up    Obesity: Patient weight today is 311 lbs with a BMI of 50.26.  Last visit it was 308 lbs with a BMI of 49.76.  she reported that she eats a well balanced diet and goes to the gym 3-4 times a week. She also does a lot of walking.  Her highest weight was 403 lbs.  She wanted to try phentermine. Today she reports that she did not like how it made her feel so she stopped taking it.  She says she would like to try wegovy.  Discuss side effects and administration.  She denies any family history of thyroid cancer,  or personal history of pancreatitis.  Discussed not safe for pregnancy.   Anxiety: she says that she has been having a lot of anxiety.  She says that she is seeing a therapist at school, she does not want to start medication yet.  Phq9 and GAD scores are positive.      07/25/2022    1:14 PM  GAD 7 : Generalized Anxiety Score  Nervous, Anxious, on Edge 1  Control/stop worrying 1  Worry too much - different things 1  Trouble relaxing 1  Restless 0  Easily annoyed or irritable 1  Afraid - awful might happen 1  Total GAD 7 Score 6  Anxiety Difficulty Somewhat difficult        07/25/2022    1:13 PM 04/04/2022   10:12 AM  Depression screen PHQ 2/9  Decreased Interest 2 0  Down, Depressed, Hopeless 0 0  PHQ - 2 Score 2 0  Altered sleeping 0   Tired, decreased energy 2   Change in appetite 2   Feeling bad or failure about yourself  0   Trouble concentrating 0   Moving slowly or fidgety/restless 0   Suicidal thoughts 0   PHQ-9 Score 6   Difficult doing work/chores Very difficult     Relevant past  medical, surgical, family and social history reviewed and updated as indicated. Interim medical history since our last visit reviewed. Allergies and medications reviewed and updated.  Review of Systems  Constitutional: Negative for fever or weight change.  Respiratory: Negative for cough and shortness of breath.   Cardiovascular: Negative for chest pain or palpitations.  Gastrointestinal: Negative for abdominal pain, no bowel changes.  Musculoskeletal: Negative for gait problem or joint swelling.  Skin: Negative for rash.  Neurological: Negative for dizziness or headache.  No other specific complaints in a complete review of systems (except as listed in HPI above).      Objective:    BP 124/82   Pulse 80   Temp 98.2 F (36.8 C) (Oral)   Resp 18   Ht 5\' 6"  (1.676 m)   Wt (!) 311 lb 6.4 oz (141.3 kg)   LMP 07/21/2022   SpO2 99%   BMI 50.26 kg/m   Wt Readings from Last 3 Encounters:  07/25/22 (!) 311 lb 6.4 oz (  141.3 kg)  04/04/22 (!) 308 lb 4.8 oz (139.8 kg)  11/10/20 280 lb (127 kg)    Physical Exam  Constitutional: Patient appears well-developed and well-nourished. Obese  No distress.  HEENT: head atraumatic, normocephalic, pupils equal and reactive to light,  neck supple Cardiovascular: Normal rate, regular rhythm and normal heart sounds.  No murmur heard. No BLE edema. Pulmonary/Chest: Effort normal and breath sounds normal. No respiratory distress. Abdominal: Soft.  There is no tenderness. Psychiatric: Patient has a normal mood and affect. behavior is normal. Judgment and thought content normal.  Results for orders placed or performed in visit on 04/04/22  Lipid panel  Result Value Ref Range   Cholesterol 241 (H) <200 mg/dL   HDL 91 > OR = 50 mg/dL   Triglycerides 80 <150 mg/dL   LDL Cholesterol (Calc) 133 (H) mg/dL (calc)   Total CHOL/HDL Ratio 2.6 <5.0 (calc)   Non-HDL Cholesterol (Calc) 150 (H) <130 mg/dL (calc)  CBC with Differential/Platelet  Result Value  Ref Range   WBC 4.2 3.8 - 10.8 Thousand/uL   RBC 3.90 3.80 - 5.10 Million/uL   Hemoglobin 12.2 11.7 - 15.5 g/dL   HCT 37.0 35.0 - 45.0 %   MCV 94.9 80.0 - 100.0 fL   MCH 31.3 27.0 - 33.0 pg   MCHC 33.0 32.0 - 36.0 g/dL   RDW 12.0 11.0 - 15.0 %   Platelets 290 140 - 400 Thousand/uL   MPV 11.3 7.5 - 12.5 fL   Neutro Abs 1,281 (L) 1,500 - 7,800 cells/uL   Lymphs Abs 2,453 850 - 3,900 cells/uL   Absolute Monocytes 328 200 - 950 cells/uL   Eosinophils Absolute 109 15 - 500 cells/uL   Basophils Absolute 29 0 - 200 cells/uL   Neutrophils Relative % 30.5 %   Total Lymphocyte 58.4 %   Monocytes Relative 7.8 %   Eosinophils Relative 2.6 %   Basophils Relative 0.7 %  COMPLETE METABOLIC PANEL WITH GFR  Result Value Ref Range   Glucose, Bld 84 65 - 99 mg/dL   BUN 9 7 - 25 mg/dL   Creat 0.84 0.50 - 0.96 mg/dL   eGFR 98 > OR = 60 mL/min/1.54m2   BUN/Creatinine Ratio SEE NOTE: 6 - 22 (calc)   Sodium 139 135 - 146 mmol/L   Potassium 4.4 3.5 - 5.3 mmol/L   Chloride 105 98 - 110 mmol/L   CO2 27 20 - 32 mmol/L   Calcium 9.4 8.6 - 10.2 mg/dL   Total Protein 7.1 6.1 - 8.1 g/dL   Albumin 4.0 3.6 - 5.1 g/dL   Globulin 3.1 1.9 - 3.7 g/dL (calc)   AG Ratio 1.3 1.0 - 2.5 (calc)   Total Bilirubin 0.6 0.2 - 1.2 mg/dL   Alkaline phosphatase (APISO) 14 (L) 31 - 125 U/L   AST 16 10 - 30 U/L   ALT 17 6 - 29 U/L  Hemoglobin A1c  Result Value Ref Range   Hgb A1c MFr Bld 5.3 <5.7 % of total Hgb   Mean Plasma Glucose 105 mg/dL   eAG (mmol/L) 5.8 mmol/L  Hepatitis C antibody  Result Value Ref Range   Hepatitis C Ab NON-REACTIVE NON-REACTIVE  HIV Antibody (routine testing w rflx)  Result Value Ref Range   HIV 1&2 Ab, 4th Generation NON-REACTIVE NON-REACTIVE  TSH  Result Value Ref Range   TSH 1.20 mIU/L      Assessment & Plan:   Problem List Items Addressed This Visit  Other   Class 3 severe obesity due to excess calories without serious comorbidity with body mass index (BMI) of 45.0 to  49.9 in adult [facility]) - Primary    Did not like the phentermine, she stopped taking it. Will try and get her approved for wegovy.       Relevant Medications   Semaglutide-Weight Management 0.25 MG/0.5ML SOAJ   Mild episode of recurrent major depressive disorder ([place])    Going to therapy, does not want to take medications at this time.      Anxiety    Going to therapy, does not want to take medications at this time.         Follow up plan: Return in about 3 months (around 10/24/2022) for follow up.

## 2022-07-25 NOTE — Assessment & Plan Note (Signed)
Did not like the phentermine, she stopped taking it. Will try and get her approved for wegovy.

## 2022-07-25 NOTE — Assessment & Plan Note (Signed)
Going to therapy, does not want to take medications at this time.  

## 2022-07-25 NOTE — Assessment & Plan Note (Signed)
Going to therapy, does not want to take medications at this time.

## 2022-07-26 ENCOUNTER — Encounter: Payer: Self-pay | Admitting: Nurse Practitioner

## 2022-07-30 ENCOUNTER — Other Ambulatory Visit: Payer: Self-pay | Admitting: Nurse Practitioner

## 2022-07-30 MED ORDER — ZEPBOUND 2.5 MG/0.5ML ~~LOC~~ SOAJ: 2.5000 mg | SUBCUTANEOUS | 0 refills | Status: DC

## 2022-10-14 DIAGNOSIS — Z113 Encounter for screening for infections with a predominantly sexual mode of transmission: Secondary | ICD-10-CM | POA: Diagnosis not present

## 2022-10-14 DIAGNOSIS — Z01419 Encounter for gynecological examination (general) (routine) without abnormal findings: Secondary | ICD-10-CM | POA: Diagnosis not present

## 2022-10-14 DIAGNOSIS — Z114 Encounter for screening for human immunodeficiency virus [HIV]: Secondary | ICD-10-CM | POA: Diagnosis not present

## 2023-01-02 ENCOUNTER — Emergency Department: Payer: BC Managed Care – PPO

## 2023-01-02 ENCOUNTER — Other Ambulatory Visit: Payer: Self-pay

## 2023-01-02 ENCOUNTER — Emergency Department
Admission: EM | Admit: 2023-01-02 | Discharge: 2023-01-02 | Disposition: A | Discharge status: Home / Self Care | Payer: BC Managed Care – PPO | Attending: Emergency Medicine | Admitting: Emergency Medicine

## 2023-01-02 DIAGNOSIS — W19XXXA Unspecified fall, initial encounter: Secondary | ICD-10-CM | POA: Diagnosis not present

## 2023-01-02 DIAGNOSIS — R55 Syncope and collapse: Secondary | ICD-10-CM | POA: Insufficient documentation

## 2023-01-02 DIAGNOSIS — I2699 Other pulmonary embolism without acute cor pulmonale: Secondary | ICD-10-CM | POA: Diagnosis not present

## 2023-01-02 DIAGNOSIS — E860 Dehydration: Secondary | ICD-10-CM | POA: Diagnosis not present

## 2023-01-02 DIAGNOSIS — R410 Disorientation, unspecified: Secondary | ICD-10-CM | POA: Diagnosis not present

## 2023-01-02 DIAGNOSIS — K760 Fatty (change of) liver, not elsewhere classified: Secondary | ICD-10-CM | POA: Diagnosis not present

## 2023-01-02 DIAGNOSIS — R000 Tachycardia, unspecified: Secondary | ICD-10-CM | POA: Diagnosis not present

## 2023-01-02 DIAGNOSIS — I517 Cardiomegaly: Secondary | ICD-10-CM | POA: Diagnosis not present

## 2023-01-02 DIAGNOSIS — S199XXA Unspecified injury of neck, initial encounter: Secondary | ICD-10-CM | POA: Diagnosis not present

## 2023-01-02 DIAGNOSIS — S0990XA Unspecified injury of head, initial encounter: Secondary | ICD-10-CM | POA: Diagnosis not present

## 2023-01-02 LAB — COMPREHENSIVE METABOLIC PANEL
ALT: 20 U/L (ref 0–44)
AST: 23 U/L (ref 15–41)
Albumin: 3.9 g/dL (ref 3.5–5.0)
Alkaline Phosphatase: 13 U/L — ABNORMAL LOW (ref 38–126)
Anion gap: 10 (ref 5–15)
BUN: 13 mg/dL (ref 6–20)
CO2: 22 mmol/L (ref 22–32)
Calcium: 8.6 mg/dL — ABNORMAL LOW (ref 8.9–10.3)
Chloride: 101 mmol/L (ref 98–111)
Creatinine, Ser: 0.92 mg/dL (ref 0.44–1.00)
GFR, Estimated: >60 mL/min (ref >60)
Glucose, Bld: 113 mg/dL — ABNORMAL HIGH (ref 70–99)
Potassium: 3.2 mmol/L — ABNORMAL LOW (ref 3.5–5.1)
Sodium: 133 mmol/L — ABNORMAL LOW (ref 135–145)
Total Bilirubin: 0.6 mg/dL (ref 0.3–1.2)
Total Protein: 7 g/dL (ref 6.5–8.1)

## 2023-01-02 LAB — URINALYSIS, ROUTINE W REFLEX MICROSCOPIC
Bilirubin Urine: NEGATIVE
Glucose, UA: NEGATIVE mg/dL
Hgb urine dipstick: NEGATIVE
Ketones, ur: NEGATIVE mg/dL
Leukocytes,Ua: NEGATIVE
Nitrite: NEGATIVE
Protein, ur: NEGATIVE mg/dL
Specific Gravity, Urine: 1.004 — ABNORMAL LOW (ref 1.005–1.030)
pH: 6 (ref 5.0–8.0)

## 2023-01-02 LAB — CBC WITH DIFFERENTIAL/PLATELET
Abs Immature Granulocytes: 0.01 10*3/uL (ref 0.00–0.07)
Basophils Absolute: 0 10*3/uL (ref 0.0–0.1)
Basophils Relative: 1 %
Eosinophils Absolute: 0.1 10*3/uL (ref 0.0–0.5)
Eosinophils Relative: 2 %
HCT: 36.2 % (ref 36.0–46.0)
Hemoglobin: 11.8 g/dL — ABNORMAL LOW (ref 12.0–15.0)
Immature Granulocytes: 0 %
Lymphocytes Relative: 65 %
Lymphs Abs: 4.2 10*3/uL — ABNORMAL HIGH (ref 0.7–4.0)
MCH: 31.1 pg (ref 26.0–34.0)
MCHC: 32.6 g/dL (ref 30.0–36.0)
MCV: 95.5 fL (ref 80.0–100.0)
Monocytes Absolute: 0.5 10*3/uL (ref 0.1–1.0)
Monocytes Relative: 8 %
Neutro Abs: 1.6 10*3/uL — ABNORMAL LOW (ref 1.7–7.7)
Neutrophils Relative %: 24 %
Platelets: 229 10*3/uL (ref 150–400)
RBC: 3.79 MIL/uL — ABNORMAL LOW (ref 3.87–5.11)
RDW: 12.5 % (ref 11.5–15.5)
WBC: 6.4 10*3/uL (ref 4.0–10.5)
nRBC: 0 % (ref 0.0–0.2)

## 2023-01-02 LAB — CK: Total CK: 136 U/L (ref 38–234)

## 2023-01-02 LAB — D-DIMER, QUANTITATIVE: D-Dimer, Quant: 0.94 ug/mL-FEU — ABNORMAL HIGH (ref 0.00–0.50)

## 2023-01-02 LAB — POC URINE PREG, ED: Preg Test, Ur: NEGATIVE

## 2023-01-02 LAB — MAGNESIUM: Magnesium: 1.8 mg/dL (ref 1.7–2.4)

## 2023-01-02 MED ORDER — IOHEXOL 350 MG/ML SOLN
100.0000 mL | Freq: Once | INTRAVENOUS | Status: AC | PRN
  Administered 2023-01-02: 100 mL via INTRAVENOUS

## 2023-01-02 MED ORDER — LACTATED RINGERS IV BOLUS
1000.0000 mL | Freq: Once | INTRAVENOUS | Status: AC
  Administered 2023-01-02: 1000 mL via INTRAVENOUS

## 2023-01-02 MED ORDER — POTASSIUM CHLORIDE CRYS ER 20 MEQ PO TBCR
40.0000 meq | EXTENDED_RELEASE_TABLET | Freq: Once | ORAL | Status: AC
  Administered 2023-01-02: 40 meq via ORAL
  Filled 2023-01-02: qty 2

## 2023-01-02 NOTE — Discharge Instructions (Signed)
As we discussed, I suspect your symptoms were form DEHYDRATION and NOT EATING ENOUGH.  Eat frequent, small meals and make sure you are drinking AT LEAST 6-8 glasses of water daily  However, because of the shaking, we're going to send you to a NEUROLOGIST for follow-up to clear you for being OK to go back to driving, or to work up seizures if needed.  For now, NO driving or operating heavy machinery.   You have been seen in the emergency department today for a likely seizure.  Your workup today including labs are within normal limits.  Please follow up with your doctor as soon as possible regarding today's emergency department visit and your likely seizure.  You will also need to follow up with a neurologist as soon as possible, please call for appointment.  If you have been prescribed a medication for your seizures, please take this medication as prescribed.  As we have discussed it is very important that you DO NOT drive until you have been seen and cleared by your neurologist.  Please drink plenty of fluids, get plenty of sleep and avoid any alcohol or drug use.  Return to the emergency department if you have any further seizures, develop any weakness/numbness of any arm/leg, confusion, slurred speech, or sudden/severe headache.

## 2023-01-02 NOTE — ED Provider Notes (Signed)
[facility] Provider Note    Event Date/Time   First MD Initiated Contact with Patient 01/02/23 0140     (approximate)   History   Loss of Consciousness   HPI  Julia Sullivan is a 27 y.o. female here with loss of consciousness.  The patient states she last remembers going to bed earlier this evening.  Per report, her mother noted a large being in her room and came to check on her.  She seems to have fallen down in her bathroom and was facing the corner.  She had what seemed to be generalized seizure-like activity.  She was not responsive.  This lasted several minutes.  She was then unresponsive for several minutes until slightly coming back around although she remained confused until EMS got there.  Patient states she does not remember thing until being loaded into the EMS truck.  No history of seizures.  She does note that she has recently started a very aggressive diet with only 400 to 500 cal/day.  Otherwise, denies any complaints.  No headaches.     Physical Exam   Triage Vital Signs: ED Triage Vitals  Enc Vitals Group     BP 01/02/23 0139 129/82     Pulse Rate 01/02/23 0139 92     Resp 01/02/23 0139 (!) 22     Temp 01/02/23 0139 98.2 F (36.8 C)     Temp Source 01/02/23 0139 Oral     SpO2 01/02/23 0139 100 %     Weight 01/02/23 0141 (!) 308 lb 3.3 oz (139.8 kg)     Height 01/02/23 0141 5\' 6"  (1.676 m)     Head Circumference --      Peak Flow --      Pain Score 01/02/23 0141 0     Pain Loc --      Pain Edu? --      Excl. in GC? --     Most recent vital signs: Vitals:   01/02/23 0530 01/02/23 0543  BP: 113/63   Pulse: 71   Resp: 15   Temp:  98.3 F (36.8 C)  SpO2: 100%      General: Awake, no distress.  CV:  Good peripheral perfusion.  Resp:  Normal work of breathing.  Abd:  No distention.  Other:  Minor tenderness over the forehead but no obvious deformity or ecchymoses.  No step-offs.  Superficial abrasion noted to the right  lateral tongue.  Cranial nerves II through XII intact.  Strength out of 5 bilateral upper and lower extremities with normal station light touch.   ED Results / Procedures / Treatments   Labs (all labs ordered are listed, but only abnormal results are displayed) Labs Reviewed  CBC WITH DIFFERENTIAL/PLATELET - Abnormal; Notable for the following components:      Result Value   RBC 3.79 (*)    Hemoglobin 11.8 (*)    Neutro Abs 1.6 (*)    Lymphs Abs 4.2 (*)    All other components within normal limits  COMPREHENSIVE METABOLIC PANEL - Abnormal; Notable for the following components:   Sodium 133 (*)    Potassium 3.2 (*)    Glucose, Bld 113 (*)    Calcium 8.6 (*)    Alkaline Phosphatase 13 (*)    All other components within normal limits  URINALYSIS, ROUTINE W REFLEX MICROSCOPIC - Abnormal; Notable for the following components:   Color, Urine STRAW (*)    APPearance CLEAR (*)  Specific Gravity, Urine 1.004 (*)    All other components within normal limits  D-DIMER, QUANTITATIVE - Abnormal; Notable for the following components:   D-Dimer, Quant 0.94 (*)    All other components within normal limits  CK  MAGNESIUM  POC URINE PREG, ED  POC URINE PREG, ED     EKG Sinus tachycardia, ventricular rate 104.  PR 170, QRS 107, QTc 43.  No acute ST elevation or depression or acute evidence of acute ischemia or infarct.   RADIOLOGY CT Angio: No PE, unremarkable, ? Mild cardiomegaly CT Head; NAICA CT C SPine: Negative   I also independently reviewed and agree with radiologist interpretations.   PROCEDURES:  Critical Care performed: No  .1-3 Lead EKG Interpretation  Performed by: Shaune Pollack, MD Authorized by: Shaune Pollack, MD     Interpretation: normal     ECG rate:  70-90   ECG rate assessment: normal     Rhythm: sinus rhythm     Ectopy: none     Conduction: normal   Comments:     Indication: Syncope     MEDICATIONS ORDERED IN ED: Medications  potassium  chloride SA (KLOR-CON M) CR tablet 40 mEq (40 mEq Oral Given 01/02/23 0332)  lactated ringers bolus 1,000 mL (0 mLs Intravenous Stopped 01/02/23 0543)  iohexol (OMNIPAQUE) 350 MG/ML injection 100 mL (100 mLs Intravenous Contrast Given 01/02/23 0437)     IMPRESSION / MDM / ASSESSMENT AND PLAN / ED COURSE  I reviewed the triage vital signs and the nursing notes.                              Differential diagnosis includes, but is not limited to, dehydration with orthostatic hypotension and convulsive syncope, seizure, anemia, arrhythmia, electrolyte abnormality, TBI  Patient's presentation is most consistent with acute presentation with potential threat to life or bodily function.  The patient is on the cardiac monitor to evaluate for evidence of arrhythmia and/or significant heart rate changes  27 yo F with no major PMHx here with syncope. Suspect orthostasis in setting of poor PO intake causing orthostatic hypotension, and convulsive syncope. However, pt reports she does not remember anything until EMS arrival and her duration of confusion after the episode raises question of seizure, as she did have convulsions/seizure like activity at the time. She also bit her tongue. CT Head/C Spine are negative. Labs are reassuring. Mild hypoK was replated. No ectopy on telemetry. Given unclear nature of her syncope, d-dimer was sent and is positive, but CT Angio fortunately shows no acute abnormality. Query of possible cardiomegaly which may be related to her obesity.  Will have her increase her PO intake and fluids, and refer to Neurology given question of seizure activity. Pt is back to her baseline now. Seizure precautions given including strict instructions of no driving.   FINAL CLINICAL IMPRESSION(S) / ED DIAGNOSES   Final diagnoses:  Dehydration  Syncope and collapse     Rx / DC Orders   ED Discharge Orders     None        Note:  This document was prepared using Dragon voice  recognition software and may include unintentional dictation errors.   Shaune Pollack, MD 01/02/23 [phone]

## 2023-01-02 NOTE — ED Triage Notes (Signed)
Patient arrives by Ems from home with complaint of syncopal event.  Mother heard a loud bang in the bathroom, and found her on the floor.  Mother reported patient was not conscious, and was shaking while she was out.  Patient has no hx of epilepsy or seizures.  She takes no daily medication.

## 2023-01-07 ENCOUNTER — Ambulatory Visit (INDEPENDENT_AMBULATORY_CARE_PROVIDER_SITE_OTHER): Payer: BC Managed Care – PPO | Admitting: Physician Assistant

## 2023-01-07 ENCOUNTER — Encounter: Payer: Self-pay | Admitting: Physician Assistant

## 2023-01-07 VITALS — BP 127/83 | HR 74 | Temp 98.6°F | Ht 66.0 in | Wt 308.0 lb

## 2023-01-07 DIAGNOSIS — R55 Syncope and collapse: Secondary | ICD-10-CM | POA: Insufficient documentation

## 2023-01-07 NOTE — Progress Notes (Signed)
Acute Office Visit   Patient: Julia Sullivan   DOB: [birth year]   27 y.o. Female  MRN: [id number] Visit Date: 01/07/2023  Today's healthcare provider: Oswaldo Conroy Anush Wiedeman, PA-C  Introduced myself to the patient as a Secondary school teacher and provided education on APPs in clinical practice.    Chief Complaint  Patient presents with   Seizures    Patient says last week she passed out and had a seizure. Patient says her mother found her and says she was seen in the ER and was told that she needs a referral to Neurology. Patient says since she had eye pain to where it looks as if the vessels in her eye red. Patient was informed the seizure happened from dehydration and not eating. Patient first experiencing a seizure.    Hospitalization Follow-up   Subjective    HPI HPI     Seizures    Additional comments: Patient says last week she passed out and had a seizure. Patient says her mother found her and says she was seen in the ER and was told that she needs a referral to Neurology. Patient says since she had eye pain to where it looks as if the vessels in her eye red. Patient was informed the seizure happened from dehydration and not eating. Patient first experiencing a seizure.       Last edited by Malen Gauze, CMA on 01/07/2023 10:54 AM.      ED follow up for syncope and potential seizure  Patient is here with her mother  Onset: sudden Occurred 01/02/23 and she was evaluated in ED for this  Reviewed ED visit notes, imaging, and lab work   She reports she is still not feeling back to herself She states she is having dizziness and pain along her left temple that radiates to her neck and shoulders She is not sure if she hit her head when she fell  She reports her eyes feel dry and foreign body sensation bilaterally  She reports she is sleeping more than usual   They are concerned that her apt with Neurology is so far out and would like a referral to another office with [facility]      Medications: Outpatient Medications Prior to Visit  Medication Sig   [DISCONTINUED] tirzepatide (ZEPBOUND) 2.5 MG/0.5ML Pen Inject 2.5 mg into the skin once a week.   No facility-administered medications prior to visit.    Review of Systems  Constitutional:  Positive for fatigue.  Eyes:  Positive for photophobia.       She reports foreign body sensation and dryness   Neurological:  Positive for dizziness and headaches.       Objective    BP 127/83   Pulse 74   Temp 98.6 F (37 C) (Oral)   Ht 5\' 6"  (1.676 m)   Wt (!) 308 lb (139.7 kg)   SpO2 98%   BMI 49.71 kg/m    Physical Exam Vitals reviewed.  Constitutional:      General: She is awake.     Appearance: Normal appearance. She is well-developed and well-groomed.  HENT:     Head: Normocephalic and atraumatic.  Eyes:     General: Lids are normal. Gaze aligned appropriately.        Right eye: No foreign body.        Left eye: No foreign body.     Extraocular Movements:     Right eye: Normal extraocular motion and  no nystagmus.     Left eye: Normal extraocular motion and no nystagmus.     Conjunctiva/sclera:     Right eye: Hemorrhage present.     Left eye: Hemorrhage present.     Pupils: Pupils are equal, round, and reactive to light.  Cardiovascular:     Rate and Rhythm: Normal rate and regular rhythm.     Pulses: Normal pulses.          Radial pulses are 2+ on the right side and 2+ on the left side.     Heart sounds: Normal heart sounds. No murmur heard.    No friction rub. No gallop.  Pulmonary:     Effort: Pulmonary effort is normal.     Breath sounds: Normal breath sounds. No decreased air movement. No decreased breath sounds, wheezing, rhonchi or rales.  Musculoskeletal:     Right lower leg: No edema.     Left lower leg: No edema.  Neurological:     General: No focal deficit present.     Mental Status: She is alert and oriented to person, place, and time.     GCS: GCS eye subscore is 4. GCS  verbal subscore is 5. GCS motor subscore is 6.     Cranial Nerves: No dysarthria or facial asymmetry.     Motor: Motor function is intact. No weakness, tremor or atrophy.     Gait: Gait is intact. Gait and tandem walk normal.  Psychiatric:        Attention and Perception: Attention and perception normal.        Mood and Affect: Mood normal. Affect is tearful.        Speech: Speech normal.        Behavior: Behavior normal. Behavior is cooperative.       No results found for any visits on 01/07/23.  Assessment & Plan      No follow-ups on file.       Problem List Items Addressed This Visit       Other   Syncope and collapse - Primary    Acute, new concern, unsure of underlying cause  Patient was evaluated in ED on 01/02/23 for syncope and collapse- Reviewed ED visit notes, imaging and lab results Differential dx for this includes but is not limited to: seizure, dehydration, hypoglycemia, orthostatic hypotension Will recheck CMP, CBC today to make sure these are improving from ED visit Recommend she rest, eats regularly and stays well hydrated We reviewed post-concussion syndrome and return to normal - information provided in AVS as some of her lingering symptoms appear to be consistent with this.  Reviewed ED and return precautions. She has Neurology apt scheduled with [facility]ters at the end of July but is requesting referral to [facility] for quicker apt- referral provided today Follow up as needed for persistent or progressing symptoms        Relevant Orders   Ambulatory referral to Neurology   CBC w/Diff   Comp Met (CMET)     No follow-ups on file.   I, Saniya Tranchina E Valente Fosberg, PA-C, have reviewed all documentation for this visit. The documentation on 01/07/23 for the exam, diagnosis, procedures, and orders are all accurate and complete.   Jacquelin Hawking, MHS, PA-C [facility]

## 2023-01-07 NOTE — Assessment & Plan Note (Addendum)
Acute, new concern, unsure of underlying cause  Patient was evaluated in ED on 01/02/23 for syncope and collapse- Reviewed ED visit notes, imaging and lab results Differential dx for this includes but is not limited to: seizure, dehydration, hypoglycemia, orthostatic hypotension Will recheck CMP, CBC today to make sure these are improving from ED visit Recommend she rest, eats regularly and stays well hydrated We reviewed post-concussion syndrome and return to normal - information provided in AVS as some of her lingering symptoms appear to be consistent with this.  Reviewed ED and return precautions. She has Neurology apt scheduled with Gavin Potters at the end of July but is requesting referral to [facility] for quicker apt- referral provided today Follow up as needed for persistent or progressing symptoms

## 2023-01-08 LAB — COMPREHENSIVE METABOLIC PANEL
ALT: 17 IU/L (ref 0–32)
AST: 24 IU/L (ref 0–40)
Albumin: 4 g/dL (ref 4.0–5.0)
Alkaline Phosphatase: 16 IU/L — ABNORMAL LOW (ref 44–121)
BUN/Creatinine Ratio: 11 (ref 9–23)
BUN: 11 mg/dL (ref 6–20)
Bilirubin Total: 0.5 mg/dL (ref 0.0–1.2)
CO2: 23 mmol/L (ref 20–29)
Calcium: 9.7 mg/dL (ref 8.7–10.2)
Chloride: 102 mmol/L (ref 96–106)
Creatinine, Ser: 0.96 mg/dL (ref 0.57–1.00)
Globulin, Total: 3.2 g/dL (ref 1.5–4.5)
Glucose: 75 mg/dL (ref 70–99)
Potassium: 4.5 mmol/L (ref 3.5–5.2)
Sodium: 139 mmol/L (ref 134–144)
Total Protein: 7.2 g/dL (ref 6.0–8.5)
eGFR: 83 mL/min/{1.73_m2} (ref >59)

## 2023-01-08 LAB — CBC WITH DIFFERENTIAL/PLATELET
Basophils Absolute: 0 10*3/uL (ref 0.0–0.2)
Basos: 1 %
EOS (ABSOLUTE): 0.1 10*3/uL (ref 0.0–0.4)
Eos: 2 %
Hematocrit: 39.7 % (ref 34.0–46.6)
Hemoglobin: 12.9 g/dL (ref 11.1–15.9)
Immature Grans (Abs): 0 10*3/uL (ref 0.0–0.1)
Immature Granulocytes: 0 %
Lymphocytes Absolute: 2.6 10*3/uL (ref 0.7–3.1)
Lymphs: 54 %
MCH: 30.9 pg (ref 26.6–33.0)
MCHC: 32.5 g/dL (ref 31.5–35.7)
MCV: 95 fL (ref 79–97)
Monocytes Absolute: 0.3 10*3/uL (ref 0.1–0.9)
Monocytes: 6 %
Neutrophils Absolute: 1.7 10*3/uL (ref 1.4–7.0)
Neutrophils: 37 %
Platelets: 246 10*3/uL (ref 150–450)
RBC: 4.18 x10E6/uL (ref 3.77–5.28)
RDW: 12.5 % (ref 11.7–15.4)
WBC: 4.7 10*3/uL (ref 3.4–10.8)

## 2023-01-08 NOTE — Progress Notes (Signed)
Labs are normal/stable.

## 2023-01-14 ENCOUNTER — Ambulatory Visit: Payer: Self-pay | Admitting: *Deleted

## 2023-01-14 ENCOUNTER — Encounter: Payer: Self-pay | Admitting: Physician Assistant

## 2023-01-14 NOTE — Telephone Encounter (Signed)
  Chief Complaint: Chest pain, dizziness, HA Symptoms: above Frequency: 3 days Pertinent Negatives: Patient denies  Disposition: [x] ED /[] Urgent Care (no appt availability in office) / [] Appointment(In office/virtual)/ []  [facility]/ [] Home Care/ [] Refused Recommended Disposition /[]  Mobile Bus/ []  Follow-up with PCP Additional Notes: Pt went to ED on 6/27. Pt was found unconscious and EMS was called. Pt is still not feeling well. She has been working indoors/remote, but today was outside all day. Pt is having chest pain, dizziness and HA for the past 3 days. Pt will go to ED for care.    Summary: Pt has some questions about what is going on with her - no further details   Pt requests to speak with a nurse because she has some questions about what is going on with her. Pt would not provide any further details. Cb# [phone]         Reason for Disposition  SEVERE chest pain  Answer Assessment - Initial Assessment Questions 1. LOCATION: "Where does it hurt?"       Upper chest 2. RADIATION: "Does the pain go anywhere else?" (e.g., into neck, jaw, arms, back)     no 3. ONSET: "When did the chest pain begin?" (Minutes, hours or days)      3 days 4. PATTERN: "Does the pain come and go, or has it been constant since it started?"  "Does it get worse with exertion?"      Comes and goes 5. DURATION: "How long does it last" (e.g., seconds, minutes, hours)     3-4 minutes 6. SEVERITY: "How bad is the pain?"  (e.g., Scale 1-10; mild, moderate, or severe)    - MILD (1-3): doesn't interfere with normal activities     - MODERATE (4-7): interferes with normal activities or awakens from sleep    - SEVERE (8-10): excruciating pain, unable to do any normal activities       7-8/10  9. CAUSE: "What do you think is causing the chest pain?"     Unsure - possible heat 10. OTHER SYMPTOMS: "Do you have any other symptoms?" (e.g., dizziness, nausea, vomiting, sweating, fever,  difficulty breathing, cough)       Dizziness, HA, Light headed  Protocols used: Chest Pain-A-AH

## 2023-01-14 NOTE — Telephone Encounter (Signed)
ummary: Pt has some questions about what is going on with her - no further details   Pt requests to speak with a nurse because she has some questions about what is going on with her. Pt would not provide any further details. Cb# [phone]      Attempted to reach pt, VM not set up. Unable to leave message to call back.

## 2023-01-15 NOTE — Telephone Encounter (Signed)
FYI

## 2023-01-16 ENCOUNTER — Ambulatory Visit (INDEPENDENT_AMBULATORY_CARE_PROVIDER_SITE_OTHER): Payer: BC Managed Care – PPO | Admitting: Nurse Practitioner

## 2023-01-16 ENCOUNTER — Other Ambulatory Visit: Payer: Self-pay

## 2023-01-16 VITALS — BP 124/72 | HR 97 | Temp 97.9°F | Resp 18 | Ht 66.0 in | Wt 311.9 lb

## 2023-01-16 DIAGNOSIS — R55 Syncope and collapse: Secondary | ICD-10-CM | POA: Diagnosis not present

## 2023-01-16 NOTE — Assessment & Plan Note (Signed)
Patient has an appointment with neurology on 02/03/2023, she needs a note to do remote work until her neurology appointment.  Note provided. Patient reports she is doing well, except she had one episode where she got too hot and that is why she needs to work remote.

## 2023-01-16 NOTE — Progress Notes (Signed)
BP 124/72   Pulse 97   Temp 97.9 F (36.6 C) (Oral)   Resp 18   Ht 5\' 6"  (1.676 m)   Wt (!) 311 lb 14.4 oz (141.5 kg)   SpO2 98%   BMI 50.34 kg/m    Subjective:    Patient ID: Julia Sullivan, female    DOB: 1995/07/11, 27 y.o.   MRN: [id number]  HPI: Julia Sullivan is a 27 y.o. female  Chief Complaint  Patient presents with   syncope and collapse   Patient was seen in er on 01/02/2023 for syncope and collapse.   Er note:  Julia Sullivan is a 27 y.o. female here with loss of consciousness.  The patient states she last remembers going to bed earlier this evening.  Per report, her mother noted a large being in her room and came to check on her.  She seems to have fallen down in her bathroom and was facing the corner.  She had what seemed to be generalized seizure-like activity.  She was not responsive.  This lasted several minutes.  She was then unresponsive for several minutes until slightly coming back around although she remained confused until EMS got there.  Patient states she does not remember thing until being loaded into the EMS truck.  No history of seizures.  She does note that she has recently started a very aggressive diet with only 400 to 500 cal/day.  Otherwise, denies any complaints.  No headaches.   27 yo F with no major PMHx here with syncope. Suspect orthostasis in setting of poor PO intake causing orthostatic hypotension, and convulsive syncope. However, pt reports she does not remember anything until EMS arrival and her duration of confusion after the episode raises question of seizure, as she did have convulsions/seizure like activity at the time. She also bit her tongue. CT Head/C Spine are negative. Labs are reassuring. Mild hypoK was replated. No ectopy on telemetry. Given unclear nature of her syncope, d-dimer was sent and is positive, but CT Angio fortunately shows no acute abnormality. Query of possible cardiomegaly which may be related to her obesity.   Will  have her increase her PO intake and fluids, and refer to Neurology given question of seizure activity. Pt is back to her baseline now. Seizure precautions given including strict instructions of no driving.  Patient was then seen by Jacquelin Hawking PA on 01/07/2023, she repeated labs which were back in normal range.     Latest Ref Rng & Units 01/07/2023   11:31 AM 01/02/2023    1:50 AM 04/04/2022   10:44 AM  CBC  WBC 3.4 - 10.8 x10E3/uL 4.7  6.4  4.2   Hemoglobin 11.1 - 15.9 g/dL 81.8  29.9  37.1   Hematocrit 34.0 - 46.6 % 39.7  36.2  37.0   Platelets 150 - 450 x10E3/uL 246  229  290        Latest Ref Rng & Units 01/07/2023   11:31 AM 01/02/2023    1:50 AM 04/04/2022   10:44 AM  CMP  Glucose 70 - 99 mg/dL 75  696  84   BUN 6 - 20 mg/dL 11  13  9    Creatinine 0.57 - 1.00 mg/dL 7.89  3.81  0.17   Sodium 134 - 144 mmol/L 139  133  139   Potassium 3.5 - 5.2 mmol/L 4.5  3.2  4.4   Chloride 96 - 106 mmol/L 102  101  105   CO2 20 -  29 mmol/L 23  22  27    Calcium 8.7 - 10.2 mg/dL 9.7  8.6  9.4   Total Protein 6.0 - 8.5 g/dL 7.2  7.0  7.1   Total Bilirubin 0.0 - 1.2 mg/dL 0.5  0.6  0.6   Alkaline Phos 44 - 121 IU/L 16  13    AST 0 - 40 IU/L 24  23  16    ALT 0 - 32 IU/L 17  20  17       Patient has a neurology appointment scheduled on 02/03/2023.   Patient reports she went back to work on Monday and previously she was work remotely since er visit.  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  She says that symptoms went away after cooling off.  She reports she needs a note to do remote work until her neurology appointment.    Relevant past medical, surgical, family and social history reviewed and updated as indicated. Interim medical history since our last visit reviewed. Allergies and medications reviewed and updated.  Review of Systems  Constitutional: Negative for fever or weight change.   Respiratory: Negative for cough and shortness of breath.   Cardiovascular: Negative for chest pain or palpitations.  Gastrointestinal: Negative for abdominal pain, no bowel changes.  Musculoskeletal: Negative for gait problem or joint swelling.  Skin: Negative for rash.  Neurological: Negative for dizziness or headache.  No other specific complaints in a complete review of systems (except as listed in HPI above).      Objective:    BP 124/72   Pulse 97   Temp 97.9 F (36.6 C) (Oral)   Resp 18   Ht 5\' 6"  (1.676 m)   Wt (!) 311 lb 14.4 oz (141.5 kg)   SpO2 98%   BMI 50.34 kg/m   Wt Readings from Last 3 Encounters:  01/16/23 (!) 311 lb 14.4 oz (141.5 kg)  01/07/23 (!) 308 lb (139.7 kg)  01/02/23 (!) 308 lb 3.3 oz (139.8 kg)    Physical Exam  Constitutional: Patient appears well-developed and well-nourished. Obese  No distress.  HEENT: head atraumatic, normocephalic, pupils equal and reactive to light, neck supple, throat within normal limits Cardiovascular: Normal rate, regular rhythm and normal heart sounds.  No murmur heard. No BLE edema. Pulmonary/Chest: Effort normal and breath sounds normal. No respiratory distress. Abdominal: Soft.  There is no tenderness. Psychiatric: Patient has a normal mood and affect. behavior is normal. Judgment and thought content normal.  Results for orders placed or performed in visit on 01/07/23  CBC w/Diff  Result Value Ref Range   WBC 4.7 3.4 - 10.8 x10E3/uL   RBC 4.18 3.77 - 5.28 x10E6/uL   Hemoglobin 12.9 11.1 - 15.9 g/dL   Hematocrit 19.1 47.8 - 46.6 %   MCV 95 79 - 97 fL   MCH 30.9 26.6 - 33.0 pg   MCHC 32.5 31.5 - 35.7 g/dL   RDW 29.5 62.1 - 30.8 %   Platelets 246 150 - 450 x10E3/uL   Neutrophils 37 Not Estab. %   Lymphs 54 Not Estab. %   Monocytes 6 Not Estab. %   Eos 2 Not Estab. %   Basos 1 Not Estab. %   Neutrophils Absolute 1.7 1.4 - 7.0 x10E3/uL   Lymphocytes Absolute 2.6 0.7 - 3.1 x10E3/uL   Monocytes Absolute 0.3 0.1  - 0.9 x10E3/uL  EOS (ABSOLUTE) 0.1 0.0 - 0.4 x10E3/uL   Basophils Absolute 0.0 0.0 - 0.2 x10E3/uL   Immature Granulocytes 0 Not Estab. %   Immature Grans (Abs) 0.0 0.0 - 0.1 x10E3/uL  Comp Met (CMET)  Result Value Ref Range   Glucose 75 70 - 99 mg/dL   BUN 11 6 - 20 mg/dL   Creatinine, Ser 7.82 0.57 - 1.00 mg/dL   eGFR 83 >95 AO/ZHY/8.65   BUN/Creatinine Ratio 11 9 - 23   Sodium 139 134 - 144 mmol/L   Potassium 4.5 3.5 - 5.2 mmol/L   Chloride 102 96 - 106 mmol/L   CO2 23 20 - 29 mmol/L   Calcium 9.7 8.7 - 10.2 mg/dL   Total Protein 7.2 6.0 - 8.5 g/dL   Albumin 4.0 4.0 - 5.0 g/dL   Globulin, Total 3.2 1.5 - 4.5 g/dL   Bilirubin Total 0.5 0.0 - 1.2 mg/dL   Alkaline Phosphatase 16 (L) 44 - 121 IU/L   AST 24 0 - 40 IU/L   ALT 17 0 - 32 IU/L      Assessment & Plan:   Problem List Items Addressed This Visit       Other   Syncope and collapse - Primary    Patient has an appointment with neurology on 02/03/2023, she needs a note to do remote work until her neurology appointment.  Note provided. Patient reports she is doing well, except she had one episode where she got too hot and that is why she needs to work remote.          Follow up plan: Return if symptoms worsen or fail to improve.

## 2023-02-03 ENCOUNTER — Ambulatory Visit: Payer: BC Managed Care – PPO | Admitting: Nurse Practitioner

## 2023-02-03 DIAGNOSIS — R55 Syncope and collapse: Secondary | ICD-10-CM | POA: Diagnosis not present

## 2023-02-03 DIAGNOSIS — Z7689 Persons encountering health services in other specified circumstances: Secondary | ICD-10-CM | POA: Diagnosis not present

## 2023-02-09 DIAGNOSIS — R569 Unspecified convulsions: Secondary | ICD-10-CM | POA: Diagnosis not present

## 2023-02-11 DIAGNOSIS — Z6841 Body Mass Index (BMI) 40.0 and over, adult: Secondary | ICD-10-CM | POA: Diagnosis not present

## 2023-02-11 DIAGNOSIS — F419 Anxiety disorder, unspecified: Secondary | ICD-10-CM | POA: Diagnosis not present

## 2023-02-11 DIAGNOSIS — F330 Major depressive disorder, recurrent, mild: Secondary | ICD-10-CM | POA: Diagnosis not present

## 2023-02-25 DIAGNOSIS — Z6841 Body Mass Index (BMI) 40.0 and over, adult: Secondary | ICD-10-CM | POA: Diagnosis not present

## 2023-02-25 DIAGNOSIS — F54 Psychological and behavioral factors associated with disorders or diseases classified elsewhere: Secondary | ICD-10-CM | POA: Diagnosis not present

## 2023-02-25 DIAGNOSIS — Z7189 Other specified counseling: Secondary | ICD-10-CM | POA: Diagnosis not present

## 2023-02-26 ENCOUNTER — Other Ambulatory Visit: Payer: Self-pay

## 2023-02-26 ENCOUNTER — Ambulatory Visit (INDEPENDENT_AMBULATORY_CARE_PROVIDER_SITE_OTHER): Payer: Medicaid Other | Admitting: Nurse Practitioner

## 2023-02-26 ENCOUNTER — Other Ambulatory Visit (HOSPITAL_COMMUNITY)
Admission: RE | Admit: 2023-02-26 | Discharge: 2023-02-26 | Disposition: A | Discharge status: Home / Self Care | Payer: Medicaid Other | Source: Ambulatory Visit | Attending: Nurse Practitioner | Admitting: Nurse Practitioner

## 2023-02-26 ENCOUNTER — Encounter: Payer: Self-pay | Admitting: Nurse Practitioner

## 2023-02-26 VITALS — BP 118/72 | HR 90 | Temp 98.1°F | Resp 16 | Ht 66.0 in | Wt 319.2 lb

## 2023-02-26 DIAGNOSIS — Z0000 Encounter for general adult medical examination without abnormal findings: Secondary | ICD-10-CM | POA: Insufficient documentation

## 2023-02-26 DIAGNOSIS — Z124 Encounter for screening for malignant neoplasm of cervix: Secondary | ICD-10-CM

## 2023-02-26 DIAGNOSIS — E782 Mixed hyperlipidemia: Secondary | ICD-10-CM | POA: Insufficient documentation

## 2023-02-26 DIAGNOSIS — Z111 Encounter for screening for respiratory tuberculosis: Secondary | ICD-10-CM | POA: Diagnosis not present

## 2023-02-26 NOTE — Progress Notes (Signed)
Name: Julia Sullivan   MRN: [id number]    DOB: 12/06/1995   Date:02/26/2023       Progress Note  Subjective  Chief Complaint  Chief Complaint  Patient presents with   Annual Exam    HPI  Patient presents for annual CPE.  Diet: Regular, tries to eat well balanced diet Exercise: 3 days 30 minutes, recommend 150 min of physical activity weekly    Sleep: 8 hours Last dental exam:August 8,2024 Last eye exam: Sept 2023  Flowsheet Row Office Visit from 02/26/2023 in [facility]  AUDIT-C Score 1      Depression: Phq 9 is  negative    02/26/2023    2:09 PM 02/26/2023    1:52 PM 01/16/2023    1:52 PM 07/25/2022    1:13 PM 04/04/2022   10:12 AM  Depression screen PHQ 2/9  Decreased Interest 0 0 0 2 0  Down, Depressed, Hopeless 0 0 0 0 0  PHQ - 2 Score 0 0 0 2 0  Altered sleeping 0   0   Tired, decreased energy 0   2   Change in appetite 0   2   Feeling bad or failure about yourself  0   0   Trouble concentrating 0   0   Moving slowly or fidgety/restless 0   0   Suicidal thoughts 0   0   PHQ-9 Score 0   6   Difficult doing work/chores Not difficult at all   Very difficult    Hypertension: BP Readings from Last 3 Encounters:  02/26/23 118/72  01/16/23 124/72  01/07/23 127/83   Obesity: Wt Readings from Last 3 Encounters:  02/26/23 (!) 319 lb 3.2 oz (144.8 kg)  01/16/23 (!) 311 lb 14.4 oz (141.5 kg)  01/07/23 (!) 308 lb (139.7 kg)   BMI Readings from Last 3 Encounters:  02/26/23 51.52 kg/m  01/16/23 50.34 kg/m  01/07/23 49.71 kg/m     Vaccines:  HPV: up to at age 59 , ask insurance if age between 70-45  Shingrix: 64-64 yo and ask insurance if covered when patient above 32 yo Pneumonia:  educated and discussed with patient. Flu:  educated and discussed with patient.  Hep C Screening: completed STD testing and prevention (HIV/chl/gon/syphilis): completed Intimate partner violence:NA Sexual History :not currently Menstrual  History/LMP/Abnormal Bleeding: [facility]:02/20/2023 Incontinence Symptoms: NO  Breast cancer:  - Last Mammogram: Patient does not qualify.   - BRCA gene screening: none  Osteoporosis: Discussed high calcium and vitamin D supplementation, weight bearing exercises  Cervical cancer screening: 12/27/2020 was abnormal will repeat today  Skin cancer: Discussed monitoring for atypical lesions  Colorectal cancer: Patient does not qualify.     Lung cancer:   Low Dose CT Chest recommended if Age 15-80 years, 20 pack-year currently smoking OR have quit w/in 15years. Patient does not qualify.   ECG: 01/02/2023  Advanced Care Planning: A voluntary discussion about advance care planning including the explanation and discussion of advance directives.  Discussed health care proxy and Living will, and the patient was able to identify a health care proxy as mom.  Patient does not have a living will at present time. If patient does have living will, I have requested they bring this to the clinic to be scanned in to their chart.  Lipids: Lab Results  Component Value Date   CHOL 241 (H) 04/04/2022   Lab Results  Component Value Date   HDL 91 04/04/2022   Lab  Results  Component Value Date   LDLCALC 133 (H) 04/04/2022   Lab Results  Component Value Date   TRIG 80 04/04/2022   Lab Results  Component Value Date   CHOLHDL 2.6 04/04/2022   No results found for: "LDLDIRECT"  Glucose: Glucose  Date Value Ref Range Status  01/07/2023 75 70 - 99 mg/dL Final   Glucose, Bld  Date Value Ref Range Status  01/02/2023 113 (H) 70 - 99 mg/dL Final    Comment:    Glucose reference range applies only to samples taken after fasting for at least 8 hours.  04/04/2022 84 65 - 99 mg/dL Final    Comment:    .            Fasting reference interval .   11/16/2016 90 65 - 99 mg/dL Final    Patient Active Problem List   Diagnosis Date Noted   Mixed hyperlipidemia 02/26/2023   Syncope and collapse 01/07/2023    Class 3 severe obesity due to excess calories without serious comorbidity with body mass index (BMI) of 45.0 to 49.9 in adult [facility]) 04/04/2022    History reviewed. No pertinent surgical history.  Family History  Problem Relation Age of Onset   Hyperlipidemia Mother    Diabetes Mother    Hypertension Mother    Hypertension Father    Hyperlipidemia Father    Diabetes Father    Cancer Sister     Social History   Socioeconomic History   Marital status: Single    Spouse name: Not on file   Number of children: Not on file   Years of education: Not on file   Highest education level: Associate degree: academic program  Occupational History   Not on file  Tobacco Use   Smoking status: Former   Smokeless tobacco: Never  Vaping Use   Vaping status: Never Used  Substance and Sexual Activity   Alcohol use: Yes   Drug use: Never   Sexual activity: Not Currently  Other Topics Concern   Not on file  Social History Narrative   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   Social Determinants of Health   Financial Resource Strain: Low Risk  (02/26/2023)   Overall Financial Resource Strain (CARDIA)    Difficulty of Paying Living Expenses: Not hard at all  Recent Concern: Financial Resource Strain - High Risk (02/10/2023)   Received from [facility]   Overall Financial Resource Strain (CARDIA)    Difficulty of Paying Living Expenses: Hard  Food Insecurity: No Food Insecurity (02/26/2023)   Hunger Vital Sign    Worried About Running Out of Food in the Last Year: Never true    Ran Out of Food in the Last Year: Never true  Recent Concern: Food Insecurity - Food Insecurity Present (02/10/2023)   Received from [facility]   Hunger Vital Sign    Worried About Running Out of Food in the Last Year: Often true    Ran Out of Food in the Last Year: Often true  Transportation Needs: No Transportation Needs (02/26/2023)   PRAPARE - Therapist, art (Medical): No    Lack of Transportation (Non-Medical): No  Recent Concern: Transportation Needs - Unmet Transportation Needs (01/16/2023)   PRAPARE - Transportation    Lack of Transportation (Medical): No    Lack of Transportation (Non-Medical): Yes  Physical Activity: Insufficiently Active (02/26/2023)   Exercise Vital Sign  Days of Exercise per Week: 3 days    Minutes of Exercise per Session: 30 min  Stress: No Stress Concern Present (02/26/2023)   [employer] of Occupational Health - Occupational Stress Questionnaire    Feeling of Stress : Only a little  Social Connections: Moderately Isolated (02/26/2023)   Social Connection and Isolation Panel [NHANES]    Frequency of Communication with Friends and Family: More than three times a week    Frequency of Social Gatherings with Friends and Family: More than three times a week    Attends Religious Services: More than 4 times per year    Active Member of [employer] or Organizations: No    Attends Banker Meetings: Never    Marital Status: Never married  Intimate Partner Violence: Not At Risk (02/26/2023)   Humiliation, Afraid, Rape, and Kick questionnaire    Fear of Current or Ex-Partner: No    Emotionally Abused: No    Physically Abused: No    Sexually Abused: No    No current outpatient medications on file.  No Known Allergies   ROS  Constitutional: Negative for fever or weight change.  Respiratory: Negative for cough and shortness of breath.   Cardiovascular: Negative for chest pain or palpitations.  Gastrointestinal: Negative for abdominal pain, no bowel changes.  Musculoskeletal: Negative for gait problem or joint swelling.  Skin: Negative for rash.  Neurological: Negative for dizziness or headache.  No other specific complaints in a complete review of systems (except as listed in HPI above).   Objective  Vitals:   02/26/23 1352  BP: 118/72  Pulse: 90  Resp: 16  Temp: 98.1 F  (36.7 C)  TempSrc: Oral  SpO2: 98%  Weight: (!) 319 lb 3.2 oz (144.8 kg)  Height: 5\' 6"  (1.676 m)    Body mass index is 51.52 kg/m.  Physical Exam Constitutional: Patient appears well-developed and well-nourished. No distress.  HENT: Head: Normocephalic and atraumatic. Ears: B TMs ok, no erythema or effusion; Nose: Nose normal. Mouth/Throat: Oropharynx is clear and moist. No oropharyngeal exudate.  Eyes: Conjunctivae and EOM are normal. Pupils are equal, round, and reactive to light. No scleral icterus.  Neck: Normal range of motion. Neck supple. No JVD present. No thyromegaly present.  Cardiovascular: Normal rate, regular rhythm and normal heart sounds.  No murmur heard. No BLE edema. Pulmonary/Chest: Effort normal and breath sounds normal. No respiratory distress. Abdominal: Soft. Bowel sounds are normal, no distension. There is no tenderness. no masses Breast: no lumps or masses, no nipple discharge or rashes Pelvic exam: normal external genitalia, vulva, vagina, cervix, uterus and adnexa.  Musculoskeletal: Normal range of motion, no joint effusions. No gross deformities Neurological: he is alert and oriented to person, place, and time. No cranial nerve deficit. Coordination, balance, strength, speech and gait are normal.  Skin: Skin is warm and dry. No rash noted. No erythema.  Psychiatric: Patient has a normal mood and affect. behavior is normal. Judgment and thought content normal.   Recent Results (from the past 2160 hour(s))  CBC with Differential     Status: Abnormal   Collection Time: 01/02/23  1:50 AM  Result Value Ref Range   WBC 6.4 4.0 - 10.5 K/uL   RBC 3.79 (L) 3.87 - 5.11 MIL/uL   Hemoglobin 11.8 (L) 12.0 - 15.0 g/dL   HCT 16.1 09.6 - 04.5 %   MCV 95.5 80.0 - 100.0 fL   MCH 31.1 26.0 - 34.0 pg   MCHC 32.6 30.0 -  36.0 g/dL   RDW 86.5 78.4 - 69.6 %   Platelets 229 150 - 400 K/uL   nRBC 0.0 0.0 - 0.2 %   Neutrophils Relative % 24 %   Neutro Abs 1.6 (L) 1.7 - 7.7  K/uL   Lymphocytes Relative 65 %   Lymphs Abs 4.2 (H) 0.7 - 4.0 K/uL   Monocytes Relative 8 %   Monocytes Absolute 0.5 0.1 - 1.0 K/uL   Eosinophils Relative 2 %   Eosinophils Absolute 0.1 0.0 - 0.5 K/uL   Basophils Relative 1 %   Basophils Absolute 0.0 0.0 - 0.1 K/uL   Immature Granulocytes 0 %   Abs Immature Granulocytes 0.01 0.00 - 0.07 K/uL    Comment: Performed at [facility], [address] [place], [place] [address]  Comprehensive metabolic panel     Status: Abnormal   Collection Time: 01/02/23  1:50 AM  Result Value Ref Range   Sodium 133 (L) 135 - 145 mmol/L   Potassium 3.2 (L) 3.5 - 5.1 mmol/L   Chloride 101 98 - 111 mmol/L   CO2 22 22 - 32 mmol/L   Glucose, Bld 113 (H) 70 - 99 mg/dL    Comment: Glucose reference range applies only to samples taken after fasting for at least 8 hours.   BUN 13 6 - 20 mg/dL   Creatinine, Ser 4.13 0.44 - 1.00 mg/dL   Calcium 8.6 (L) 8.9 - 10.3 mg/dL   Total Protein 7.0 6.5 - 8.1 g/dL   Albumin 3.9 3.5 - 5.0 g/dL   AST 23 15 - 41 U/L   ALT 20 0 - 44 U/L   Alkaline Phosphatase 13 (L) 38 - 126 U/L   Total Bilirubin 0.6 0.3 - 1.2 mg/dL   GFR, Estimated >24 >40 mL/min    Comment: (NOTE) Calculated using the CKD-EPI Creatinine Equation (2021)    Anion gap 10 5 - 15    Comment: Performed at [facility], [address] [place], [place] [address]  CK     Status: None   Collection Time: 01/02/23  1:50 AM  Result Value Ref Range   Total CK 136 38 - 234 U/L    Comment: Performed at [facility], [address] [place], [place] [address]  D-dimer, quantitative     Status: Abnormal   Collection Time: 01/02/23  1:50 AM  Result Value Ref Range   D-Dimer, Quant 0.94 (H) 0.00 - 0.50 ug/mL-FEU    Comment: (NOTE) At the manufacturer cut-off value of 0.5 g/mL FEU, this assay has a negative predictive value of 95-100%.This assay is intended for use in conjunction with a clinical pretest probability (PTP)  assessment model to exclude pulmonary embolism (PE) and deep venous thrombosis (DVT) in outpatients suspected of PE or DVT. Results should be correlated with clinical presentation. Performed at [facility], [address] [place], [place] [address]   Magnesium     Status: None   Collection Time: 01/02/23  1:53 AM  Result Value Ref Range   Magnesium 1.8 1.7 - 2.4 mg/dL    Comment: Performed at [facility], [address] [place], [place] [address]  Urinalysis, Routine w reflex microscopic -Urine, Clean Catch     Status: Abnormal   Collection Time: 01/02/23  3:12 AM  Result Value Ref Range   Color, Urine STRAW (A) YELLOW   APPearance CLEAR (A) CLEAR   Specific Gravity, Urine 1.004 (L) 1.005 - 1.030   pH 6.0 5.0 -  8.0   Glucose, UA NEGATIVE NEGATIVE mg/dL   Hgb urine dipstick NEGATIVE NEGATIVE   Bilirubin Urine NEGATIVE NEGATIVE   Ketones, ur NEGATIVE NEGATIVE mg/dL   Protein, ur NEGATIVE NEGATIVE mg/dL   Nitrite NEGATIVE NEGATIVE   Leukocytes,Ua NEGATIVE NEGATIVE    Comment: Performed at [facility], [address] [place], [place] [address]  POC Urine Pregnancy, ED     Status: None   Collection Time: 01/02/23  3:40 AM  Result Value Ref Range   Preg Test, Ur Negative Negative  CBC w/Diff     Status: None   Collection Time: 01/07/23 11:31 AM  Result Value Ref Range   WBC 4.7 3.4 - 10.8 x10E3/uL   RBC 4.18 3.77 - 5.28 x10E6/uL   Hemoglobin 12.9 11.1 - 15.9 g/dL   Hematocrit 60.4 54.0 - 46.6 %   MCV 95 79 - 97 fL   MCH 30.9 26.6 - 33.0 pg   MCHC 32.5 31.5 - 35.7 g/dL   RDW 98.1 19.1 - 47.8 %   Platelets 246 150 - 450 x10E3/uL   Neutrophils 37 Not Estab. %   Lymphs 54 Not Estab. %   Monocytes 6 Not Estab. %   Eos 2 Not Estab. %   Basos 1 Not Estab. %   Neutrophils Absolute 1.7 1.4 - 7.0 x10E3/uL   Lymphocytes Absolute 2.6 0.7 - 3.1 x10E3/uL   Monocytes Absolute 0.3 0.1 - 0.9 x10E3/uL   EOS (ABSOLUTE) 0.1 0.0 - 0.4 x10E3/uL   Basophils  Absolute 0.0 0.0 - 0.2 x10E3/uL   Immature Granulocytes 0 Not Estab. %   Immature Grans (Abs) 0.0 0.0 - 0.1 x10E3/uL  Comp Met (CMET)     Status: Abnormal   Collection Time: 01/07/23 11:31 AM  Result Value Ref Range   Glucose 75 70 - 99 mg/dL   BUN 11 6 - 20 mg/dL   Creatinine, Ser 2.95 0.57 - 1.00 mg/dL   eGFR 83 >62 ZH/YQM/5.78   BUN/Creatinine Ratio 11 9 - 23   Sodium 139 134 - 144 mmol/L   Potassium 4.5 3.5 - 5.2 mmol/L   Chloride 102 96 - 106 mmol/L   CO2 23 20 - 29 mmol/L   Calcium 9.7 8.7 - 10.2 mg/dL   Total Protein 7.2 6.0 - 8.5 g/dL   Albumin 4.0 4.0 - 5.0 g/dL   Globulin, Total 3.2 1.5 - 4.5 g/dL   Bilirubin Total 0.5 0.0 - 1.2 mg/dL   Alkaline Phosphatase 16 (L) 44 - 121 IU/L   AST 24 0 - 40 IU/L   ALT 17 0 - 32 IU/L      Fall Risk:    02/26/2023    1:51 PM 01/16/2023    1:51 PM 07/25/2022    1:13 PM 04/04/2022   10:11 AM  Fall Risk   Falls in the past year? 0 0 0 0  Number falls in past yr: 0 0 0 0  Injury with Fall? 0 0 0 0  Follow up    Falls evaluation completed     Functional Status Survey: Is the patient deaf or have difficulty hearing?: No Does the patient have difficulty seeing, even when wearing glasses/contacts?: No Does the patient have difficulty concentrating, remembering, or making decisions?: No Does the patient have difficulty walking or climbing stairs?: No Does the patient have difficulty dressing or bathing?: No Does the patient have difficulty doing errands alone such as visiting a doctor's office or shopping?: No   Assessment & Plan  1. Annual physical exam Increase physical activity recommend 150 min of physical activity weekly   Continue to work on lifestyle modification including eating well balanced diet with portion control.  - QuantiFERON-TB Gold Plus - Lipid panel - Cytology - PAP  2. Screening examination for pulmonary tuberculosis  - QuantiFERON-TB Gold Plus  3. Mixed hyperlipidemia  - Lipid panel  4. Screening  for cervical cancer  - Cytology - PAP   -USPSTF grade A and B recommendations reviewed with patient; age-appropriate recommendations, preventive care, screening tests, etc discussed and encouraged; healthy living encouraged; see AVS for patient education given to patient -Discussed importance of 150 minutes of physical activity weekly, eat two servings of fish weekly, eat one serving of tree nuts ( cashews, pistachios, pecans, almonds.Marland Kitchen) every other day, eat 6 servings of fruit/vegetables daily and drink plenty of water and avoid sweet beverages.   -Reviewed Health Maintenance: yes

## 2023-02-28 LAB — CYTOLOGY - PAP
Chlamydia: NEGATIVE
Comment: NEGATIVE
Comment: NORMAL
Diagnosis: NEGATIVE
Neisseria Gonorrhea: NEGATIVE

## 2023-02-28 LAB — LIPID PANEL
Cholesterol: 241 mg/dL — ABNORMAL HIGH (ref <200)
HDL: 91 mg/dL (ref >=50)
LDL Cholesterol (Calc): 131 mg/dL — ABNORMAL HIGH
Non-HDL Cholesterol (Calc): 150 mg/dL (calc) — ABNORMAL HIGH (ref <130)
Total CHOL/HDL Ratio: 2.6 (calc) (ref <5.0)
Triglycerides: 87 mg/dL (ref <150)

## 2023-02-28 LAB — QUANTIFERON-TB GOLD PLUS
Mitogen-NIL: >10 [IU]/mL
NIL: 0.05 [IU]/mL
QuantiFERON-TB Gold Plus: NEGATIVE
TB1-NIL: <0 [IU]/mL
TB2-NIL: <0 [IU]/mL

## 2023-03-04 DIAGNOSIS — Z87891 Personal history of nicotine dependence: Secondary | ICD-10-CM | POA: Diagnosis not present

## 2023-03-04 DIAGNOSIS — R5383 Other fatigue: Secondary | ICD-10-CM | POA: Diagnosis not present

## 2023-03-04 DIAGNOSIS — F330 Major depressive disorder, recurrent, mild: Secondary | ICD-10-CM | POA: Diagnosis not present

## 2023-03-04 DIAGNOSIS — E7800 Pure hypercholesterolemia, unspecified: Secondary | ICD-10-CM | POA: Diagnosis not present

## 2023-03-04 DIAGNOSIS — F419 Anxiety disorder, unspecified: Secondary | ICD-10-CM | POA: Diagnosis not present

## 2023-03-04 DIAGNOSIS — R55 Syncope and collapse: Secondary | ICD-10-CM | POA: Diagnosis not present

## 2023-03-04 DIAGNOSIS — E782 Mixed hyperlipidemia: Secondary | ICD-10-CM | POA: Diagnosis not present

## 2023-03-11 DIAGNOSIS — F419 Anxiety disorder, unspecified: Secondary | ICD-10-CM | POA: Diagnosis not present

## 2023-03-11 DIAGNOSIS — E7800 Pure hypercholesterolemia, unspecified: Secondary | ICD-10-CM | POA: Diagnosis not present

## 2023-03-12 DIAGNOSIS — Z6841 Body Mass Index (BMI) 40.0 and over, adult: Secondary | ICD-10-CM | POA: Diagnosis not present

## 2023-04-03 ENCOUNTER — Emergency Department: Payer: Medicaid Other

## 2023-04-03 ENCOUNTER — Encounter: Payer: Self-pay | Admitting: *Deleted

## 2023-04-03 ENCOUNTER — Other Ambulatory Visit: Payer: Self-pay

## 2023-04-03 ENCOUNTER — Emergency Department
Admission: EM | Admit: 2023-04-03 | Discharge: 2023-04-03 | Disposition: A | Discharge status: Home / Self Care | Payer: Medicaid Other | Attending: Emergency Medicine | Admitting: Emergency Medicine

## 2023-04-03 DIAGNOSIS — N838 Other noninflammatory disorders of ovary, fallopian tube and broad ligament: Secondary | ICD-10-CM

## 2023-04-03 DIAGNOSIS — N200 Calculus of kidney: Secondary | ICD-10-CM | POA: Diagnosis not present

## 2023-04-03 DIAGNOSIS — R1032 Left lower quadrant pain: Secondary | ICD-10-CM | POA: Diagnosis not present

## 2023-04-03 DIAGNOSIS — N83202 Unspecified ovarian cyst, left side: Secondary | ICD-10-CM | POA: Diagnosis not present

## 2023-04-03 DIAGNOSIS — M5459 Other low back pain: Secondary | ICD-10-CM | POA: Diagnosis not present

## 2023-04-03 DIAGNOSIS — M545 Low back pain, unspecified: Secondary | ICD-10-CM

## 2023-04-03 LAB — URINALYSIS, ROUTINE W REFLEX MICROSCOPIC
Bilirubin Urine: NEGATIVE
Glucose, UA: NEGATIVE mg/dL
Hgb urine dipstick: NEGATIVE
Ketones, ur: NEGATIVE mg/dL
Leukocytes,Ua: NEGATIVE
Nitrite: NEGATIVE
Protein, ur: NEGATIVE mg/dL
Specific Gravity, Urine: 1.018 (ref 1.005–1.030)
pH: 7 (ref 5.0–8.0)

## 2023-04-03 LAB — POC URINE PREG, ED: Preg Test, Ur: NEGATIVE

## 2023-04-03 MED ORDER — OXYCODONE-ACETAMINOPHEN 5-325 MG PO TABS
1.0000 | ORAL_TABLET | Freq: Once | ORAL | Status: AC
  Administered 2023-04-03: 1 via ORAL
  Filled 2023-04-03: qty 1

## 2023-04-03 MED ORDER — BACLOFEN 10 MG PO TABS: 10.0000 mg | ORAL_TABLET | Freq: Three times a day (TID) | ORAL | 0 refills | Status: AC

## 2023-04-03 MED ORDER — ONDANSETRON 4 MG PO TBDP
4.0000 mg | ORAL_TABLET | Freq: Once | ORAL | Status: AC
  Administered 2023-04-03: 4 mg via ORAL
  Filled 2023-04-03: qty 1

## 2023-04-03 MED ORDER — MELOXICAM 15 MG PO TABS: 15.0000 mg | ORAL_TABLET | Freq: Every day | ORAL | 2 refills | Status: DC

## 2023-04-03 NOTE — ED Provider Notes (Signed)
[facility] Provider Note    Event Date/Time   First MD Initiated Contact with Patient 04/03/23 [phone]     (approximate)   History   Back Pain   HPI  Julia Sullivan is a 27 y.o. female with history of seizures presents emergency department with low back pain that radiates to the left lower quadrant.  Patient states the pain is sharp in nature.  No vomiting.  No dysuria.  Has not noticed blood in her urine.  Denies fever or chills.  States the pain is worse with movement.  Symptoms have been ongoing for 4 days.      Physical Exam   Triage Vital Signs: ED Triage Vitals  Encounter Vitals Group     BP 04/03/23 0729 (!) 127/90     Systolic BP Percentile --      Diastolic BP Percentile --      Pulse Rate 04/03/23 0729 90     Resp 04/03/23 0729 16     Temp 04/03/23 0729 98.5 F (36.9 C)     Temp Source 04/03/23 0729 Oral     SpO2 04/03/23 0729 100 %     Weight 04/03/23 0742 (!) 319 lb 3.6 oz (144.8 kg)     Height 04/03/23 0742 5\' 6"  (1.676 m)     Head Circumference --      Peak Flow --      Pain Score 04/03/23 0732 9     Pain Loc --      Pain Education --      Exclude from Growth Chart --     Most recent vital signs: Vitals:   04/03/23 0729  BP: (!) 127/90  Pulse: 90  Resp: 16  Temp: 98.5 F (36.9 C)  SpO2: 100%     General: Awake, no distress.   CV:  Good peripheral perfusion. regular rate and  rhythm Resp:  Normal effort. Lungs cta Abd:  No distention.  Tender in the left lower quadrant, positive CVA tenderness, sounds normal Other:  Lumbar spine nontender, T-spine nontender, patient does move slowly secondary discomfort, 5 or 5 strength lower extremities   ED Results / Procedures / Treatments   Labs (all labs ordered are listed, but only abnormal results are displayed) Labs Reviewed  URINALYSIS, ROUTINE W REFLEX MICROSCOPIC - Abnormal; Notable for the following components:      Result Value   Color, Urine YELLOW (*)     APPearance CLEAR (*)    All other components within normal limits  POC URINE PREG, ED     EKG     RADIOLOGY CT renal stone    PROCEDURES:   Procedures   MEDICATIONS ORDERED IN ED: Medications  oxyCODONE-acetaminophen (PERCOCET/ROXICET) 5-325 MG per tablet 1 tablet (1 tablet Oral Given 04/03/23 0808)  ondansetron (ZOFRAN-ODT) disintegrating tablet 4 mg (4 mg Oral Given 04/03/23 0902)     IMPRESSION / MDM / ASSESSMENT AND PLAN / ED COURSE  I reviewed the triage vital signs and the nursing notes.                              Differential diagnosis includes, but is not limited to, kidney stone, pyelonephritis, muscle strain, back pain  Patient's presentation is most consistent with acute illness / injury with system symptoms.   Due to the left lower quadrant tenderness along with radiation of pain from the flank to the front will look at a  CT renal stone to rule out kidney stone.  CT renal stone, UA, POC pregnancy  Patient was given Percocet p.o. for pain   CT renal stone study, independently reviewed interpreted by me as being negative for any acute abnormality, over the radiologist does comment on a solid appearing mass on the left ovary, comments this could be an enlarged ovary or fibroid recommends follow-up with a ultrasound.  urine pregnancy, UA are reassuring  I did explain these findings to the patient.  Feel that her pain today is most likely all musculoskeletal.  She can follow-up with GYN for an ultrasound to determine if this is a fibroid or enlarged ovary.  Do not feel the patient has ovarian torsion as she is not in severe pain.  Patient is in agreement treatment plan at this time.  Strict instructions to return if worsening.  She was discharged stable condition.   FINAL CLINICAL IMPRESSION(S) / ED DIAGNOSES   Final diagnoses:  Acute midline low back pain without sciatica  Ovarian mass, left     Rx / DC Orders   ED Discharge Orders           Ordered    meloxicam (MOBIC) 15 MG tablet  Daily        04/03/23 0853    baclofen (LIORESAL) 10 MG tablet  3 times daily        04/03/23 4782             Note:  This document was prepared using Dragon voice recognition software and may include unintentional dictation errors.    Faythe Ghee, PA-C 04/03/23 1139    Sharman Cheek, MD 04/04/23 1239

## 2023-04-03 NOTE — Discharge Instructions (Addendum)
Follow up with Dr Oretha Milch office, call for an appointment Take the medication as prescribed

## 2023-04-03 NOTE — ED Notes (Signed)
See triage note  Presents with pain to left lower back which is moving into lower abd  States pain started on Monday  Pain increases with movement  Denies any n/v/  fever or urinary sxs'

## 2023-04-03 NOTE — ED Triage Notes (Signed)
Pt is here for lower back pain pain since Monday.  Pt states that she lifted a heavy chair Sunday and had mild pain on Monday which got worse on Tuesday.  Pt denies any GU symptoms but reports that the pain radiates to LLQ.  No n/v/d or fever with this.

## 2023-04-08 DIAGNOSIS — Z6841 Body Mass Index (BMI) 40.0 and over, adult: Secondary | ICD-10-CM | POA: Diagnosis not present

## 2023-04-08 DIAGNOSIS — E7800 Pure hypercholesterolemia, unspecified: Secondary | ICD-10-CM | POA: Diagnosis not present

## 2023-04-10 DIAGNOSIS — E66813 Obesity, class 3: Secondary | ICD-10-CM | POA: Diagnosis not present

## 2023-04-10 DIAGNOSIS — Z6841 Body Mass Index (BMI) 40.0 and over, adult: Secondary | ICD-10-CM | POA: Diagnosis not present

## 2023-04-11 DIAGNOSIS — F419 Anxiety disorder, unspecified: Secondary | ICD-10-CM | POA: Diagnosis not present

## 2023-04-11 DIAGNOSIS — Z01818 Encounter for other preprocedural examination: Secondary | ICD-10-CM | POA: Diagnosis not present

## 2023-04-11 DIAGNOSIS — E7800 Pure hypercholesterolemia, unspecified: Secondary | ICD-10-CM | POA: Diagnosis not present

## 2023-04-21 DIAGNOSIS — F419 Anxiety disorder, unspecified: Secondary | ICD-10-CM | POA: Diagnosis not present

## 2023-04-21 DIAGNOSIS — E7800 Pure hypercholesterolemia, unspecified: Secondary | ICD-10-CM | POA: Diagnosis not present

## 2023-04-21 DIAGNOSIS — E66813 Obesity, class 3: Secondary | ICD-10-CM | POA: Diagnosis not present

## 2023-04-21 DIAGNOSIS — F330 Major depressive disorder, recurrent, mild: Secondary | ICD-10-CM | POA: Diagnosis not present

## 2023-04-21 DIAGNOSIS — R569 Unspecified convulsions: Secondary | ICD-10-CM | POA: Diagnosis not present

## 2023-04-21 DIAGNOSIS — Z6841 Body Mass Index (BMI) 40.0 and over, adult: Secondary | ICD-10-CM | POA: Diagnosis not present

## 2023-04-22 DIAGNOSIS — R569 Unspecified convulsions: Secondary | ICD-10-CM | POA: Diagnosis not present

## 2023-04-30 DIAGNOSIS — R569 Unspecified convulsions: Secondary | ICD-10-CM | POA: Diagnosis not present

## 2023-05-15 ENCOUNTER — Emergency Department
Admission: EM | Admit: 2023-05-15 | Discharge: 2023-05-15 | Disposition: A | Discharge status: Home / Self Care | Payer: Medicaid Other | Attending: Emergency Medicine | Admitting: Emergency Medicine

## 2023-05-15 ENCOUNTER — Other Ambulatory Visit: Payer: Self-pay

## 2023-05-15 DIAGNOSIS — R42 Dizziness and giddiness: Secondary | ICD-10-CM | POA: Diagnosis present

## 2023-05-15 DIAGNOSIS — E639 Nutritional deficiency, unspecified: Secondary | ICD-10-CM

## 2023-05-15 DIAGNOSIS — E638 Other specified nutritional deficiencies: Secondary | ICD-10-CM | POA: Insufficient documentation

## 2023-05-15 DIAGNOSIS — E860 Dehydration: Secondary | ICD-10-CM | POA: Insufficient documentation

## 2023-05-15 LAB — CBC
HCT: 35.7 % — ABNORMAL LOW (ref 36.0–46.0)
Hemoglobin: 11.9 g/dL — ABNORMAL LOW (ref 12.0–15.0)
MCH: 31.7 pg (ref 26.0–34.0)
MCHC: 33.3 g/dL (ref 30.0–36.0)
MCV: 95.2 fL (ref 80.0–100.0)
Platelets: 210 10*3/uL (ref 150–400)
RBC: 3.75 MIL/uL — ABNORMAL LOW (ref 3.87–5.11)
RDW: 13.1 % (ref 11.5–15.5)
WBC: 3.7 10*3/uL — ABNORMAL LOW (ref 4.0–10.5)
nRBC: 0 % (ref 0.0–0.2)

## 2023-05-15 LAB — BASIC METABOLIC PANEL
Anion gap: 11 (ref 5–15)
BUN: 9 mg/dL (ref 6–20)
CO2: 20 mmol/L — ABNORMAL LOW (ref 22–32)
Calcium: 8.8 mg/dL — ABNORMAL LOW (ref 8.9–10.3)
Chloride: 104 mmol/L (ref 98–111)
Creatinine, Ser: 0.82 mg/dL (ref 0.44–1.00)
GFR, Estimated: >60 mL/min (ref >60)
Glucose, Bld: 81 mg/dL (ref 70–99)
Potassium: 3.4 mmol/L — ABNORMAL LOW (ref 3.5–5.1)
Sodium: 135 mmol/L (ref 135–145)

## 2023-05-15 LAB — URINALYSIS, ROUTINE W REFLEX MICROSCOPIC
Bilirubin Urine: NEGATIVE
Glucose, UA: NEGATIVE mg/dL
Ketones, ur: 20 mg/dL — AB
Leukocytes,Ua: NEGATIVE
Nitrite: NEGATIVE
Protein, ur: NEGATIVE mg/dL
RBC / HPF: >50 RBC/hpf (ref 0–5)
Specific Gravity, Urine: 1.013 (ref 1.005–1.030)
pH: 5 (ref 5.0–8.0)

## 2023-05-15 LAB — HEPATIC FUNCTION PANEL
ALT: 21 U/L (ref 0–44)
AST: 24 U/L (ref 15–41)
Albumin: 3.8 g/dL (ref 3.5–5.0)
Alkaline Phosphatase: 12 U/L — ABNORMAL LOW (ref 38–126)
Bilirubin, Direct: <0.1 mg/dL (ref 0.0–0.2)
Total Bilirubin: 0.8 mg/dL (ref <1.2)
Total Protein: 7.2 g/dL (ref 6.5–8.1)

## 2023-05-15 LAB — POC URINE PREG, ED: Preg Test, Ur: NEGATIVE

## 2023-05-15 NOTE — ED Triage Notes (Signed)
Pt c/o feeling dizzy that started last week. Pt reports having gastric surgery on 04/21/23. Pt reports hx of seizures.

## 2023-05-15 NOTE — ED Provider Notes (Signed)
[facility] Provider Note   Event Date/Time   First MD Initiated Contact with Patient 05/15/23 1047     (approximate) History  Dizziness  HPI Julia Sullivan is a 27 y.o. female with recent gastric sleeve surgery who presents complaining of lightheadedness after attempting to change her diet from clear liquids back to solids.  Patient states that she does not feel she is getting enough p.o. intake.  Patient states that she is now having intermittent lightheadedness especially when standing from a seated position.   ROS: Patient currently denies any fevers, vision changes, tinnitus, difficulty speaking, facial droop, sore throat, chest pain, shortness of breath, abdominal pain, nausea/vomiting/diarrhea, dysuria, or weakness/numbness/paresthesias in any extremity   Physical Exam  Triage Vital Signs: ED Triage Vitals  Encounter Vitals Group     BP 05/15/23 1003 122/89     Systolic BP Percentile --      Diastolic BP Percentile --      Pulse Rate 05/15/23 1003 92     Resp 05/15/23 1003 16     Temp 05/15/23 1003 98 F (36.7 C)     Temp Source 05/15/23 1003 Oral     SpO2 05/15/23 1003 99 %     Weight 05/15/23 1004 277 lb (125.6 kg)     Height 05/15/23 1004 5\' 6"  (1.676 m)     Head Circumference --      Peak Flow --      Pain Score 05/15/23 1004 9     Pain Loc --      Pain Education --      Exclude from Growth Chart --    Most recent vital signs: Vitals:   05/15/23 1003 05/15/23 1220  BP: 122/89   Pulse: 92   Resp: 16   Temp: 98 F (36.7 C)   SpO2: 99% 99%   General: Awake, oriented x4. CV:  Good peripheral perfusion.  Resp:  Normal effort.  Abd:  No distention.  Nontender to palpation Other:  Young adult obese African-American female resting comfortably in no acute distress ED Results / Procedures / Treatments  Labs (all labs ordered are listed, but only abnormal results are displayed) Labs Reviewed  BASIC METABOLIC PANEL - Abnormal; Notable  for the following components:      Result Value   Potassium 3.4 (*)    CO2 20 (*)    Calcium 8.8 (*)    All other components within normal limits  CBC - Abnormal; Notable for the following components:   WBC 3.7 (*)    RBC 3.75 (*)    Hemoglobin 11.9 (*)    HCT 35.7 (*)    All other components within normal limits  URINALYSIS, ROUTINE W REFLEX MICROSCOPIC - Abnormal; Notable for the following components:   Color, Urine YELLOW (*)    APPearance CLEAR (*)    Hgb urine dipstick LARGE (*)    Ketones, ur 20 (*)    Bacteria, UA RARE (*)    All other components within normal limits  HEPATIC FUNCTION PANEL - Abnormal; Notable for the following components:   Alkaline Phosphatase 12 (*)    All other components within normal limits  POC URINE PREG, ED   EKG ED ECG REPORT I, Merwyn Katos, the attending physician, personally viewed and interpreted this ECG. Date: 05/15/2023 EKG Time: 1009 Rate: 89 Rhythm: normal sinus rhythm QRS Axis: normal Intervals: normal ST/T Wave abnormalities: normal Narrative Interpretation: no evidence of acute ischemia PROCEDURES: Critical Care performed:  No Procedures MEDICATIONS ORDERED IN ED: Medications - No data to display IMPRESSION / MDM / ASSESSMENT AND PLAN / ED COURSE  I reviewed the triage vital signs and the nursing notes.                             The patient is on the cardiac monitor to evaluate for evidence of arrhythmia and/or significant heart rate changes. Patient's presentation is most consistent with acute presentation with potential threat to life or bodily function. Based on History, Exam, and Findings, presentation not consistent with syncope, seizure, stroke, meningitis, symptomatic anemia (gastrointestinal bleed), Increased ICP (cerebral tumor/mass), ICH. Additionally, I have a low suspicion for AOM, labyrinthitis, or other infectious process.  Reassessment: Prior to discharge symptoms controlled, patient well  appearing. Disposition:  Discharge. Strict return precautions discussed w/ full understanding. Advise follow up with primary care provider within 24-48 hours.   FINAL CLINICAL IMPRESSION(S) / ED DIAGNOSES   Final diagnoses:  Lightheadedness  Poor nutrition  Dehydration   Rx / DC Orders   ED Discharge Orders     None      Note:  This document was prepared using Dragon voice recognition software and may include unintentional dictation errors.   Merwyn Katos, MD 05/15/23 1229

## 2023-06-16 DIAGNOSIS — R569 Unspecified convulsions: Secondary | ICD-10-CM | POA: Diagnosis not present

## 2023-08-04 ENCOUNTER — Encounter: Payer: Self-pay | Admitting: Nurse Practitioner

## 2023-08-04 ENCOUNTER — Ambulatory Visit (INDEPENDENT_AMBULATORY_CARE_PROVIDER_SITE_OTHER): Payer: Medicaid Other | Admitting: Nurse Practitioner

## 2023-08-04 VITALS — BP 132/82 | Temp 97.8°F | Resp 18 | Ht 66.0 in | Wt 256.0 lb

## 2023-08-04 DIAGNOSIS — Z903 Acquired absence of stomach [part of]: Secondary | ICD-10-CM | POA: Diagnosis not present

## 2023-08-04 DIAGNOSIS — K5900 Constipation, unspecified: Secondary | ICD-10-CM

## 2023-08-04 DIAGNOSIS — R590 Localized enlarged lymph nodes: Secondary | ICD-10-CM | POA: Diagnosis not present

## 2023-08-04 DIAGNOSIS — Z86718 Personal history of other venous thrombosis and embolism: Secondary | ICD-10-CM | POA: Diagnosis not present

## 2023-08-04 MED ORDER — TRULANCE 3 MG PO TABS: 3.0000 mg | ORAL_TABLET | Freq: Every day | ORAL | 0 refills | Status: DC

## 2023-08-04 NOTE — Progress Notes (Signed)
BP 132/82   Temp 97.8 F (36.6 C)   Resp 18   Ht 5\' 6"  (1.676 m)   Wt 256 lb (116.1 kg)   LMP 07/17/2023   BMI 41.32 kg/m    Subjective:    Patient ID: Julia Sullivan, female    DOB: 1996/03/12, 28 y.o.   MRN: [id number]  HPI: Julia Sullivan is a 28 y.o. female  Chief Complaint  Patient presents with   Constipation    Discussed the use of AI scribe software for clinical note transcription with the patient, who gave verbal consent to proceed.  History of Present Illness   The patient, with a history of VSD, presents with constipation. Despite trying over-the-counter laxatives, stool softeners, and prune juice, they continue to experience discomfort. They also mention the use of protein shakes, which they suspect may be contributing to their symptoms. They report adequate water intake and recently returned from a cruise where they did not consume any protein shakes and noticed an improvement in their symptoms. However, upon resuming the protein shakes, the constipation returned. They also request blood work to check for any deficiencies.       08/04/2023    9:30 AM 02/26/2023    2:09 PM 02/26/2023    1:52 PM  Depression screen PHQ 2/9  Decreased Interest 0 0 0  Down, Depressed, Hopeless 0 0 0  PHQ - 2 Score 0 0 0  Altered sleeping 0 0   Tired, decreased energy 0 0   Change in appetite 0 0   Feeling bad or failure about yourself  0 0   Trouble concentrating 0 0   Moving slowly or fidgety/restless 0 0   Suicidal thoughts 0 0   PHQ-9 Score 0 0   Difficult doing work/chores Not difficult at all Not difficult at all     Relevant past medical, surgical, family and social history reviewed and updated as indicated. Interim medical history since our last visit reviewed. Allergies and medications reviewed and updated.  Review of Systems  Ten systems reviewed and is negative except as mentioned in HPI      Objective:    BP 132/82   Temp 97.8 F (36.6 C)   Resp 18   Ht  5\' 6"  (1.676 m)   Wt 256 lb (116.1 kg)   LMP 07/17/2023   BMI 41.32 kg/m    Wt Readings from Last 3 Encounters:  08/04/23 256 lb (116.1 kg)  05/15/23 277 lb (125.6 kg)  04/03/23 (!) 319 lb 3.6 oz (144.8 kg)    Physical Exam Vitals reviewed.  Constitutional:      Appearance: Normal appearance.  HENT:     Head: Normocephalic.  Cardiovascular:     Rate and Rhythm: Normal rate and regular rhythm.  Pulmonary:     Effort: Pulmonary effort is normal.     Breath sounds: Normal breath sounds.  Musculoskeletal:        General: Normal range of motion.  Skin:    General: Skin is warm and dry.  Neurological:     General: No focal deficit present.     Mental Status: She is alert and oriented to person, place, and time. Mental status is at baseline.  Psychiatric:        Mood and Affect: Mood normal.        Behavior: Behavior normal.        Thought Content: Thought content normal.        Judgment: Judgment normal.  Results for orders placed or performed during the hospital encounter of 05/15/23  Basic metabolic panel   Collection Time: 05/15/23 10:06 AM  Result Value Ref Range   Sodium 135 135 - 145 mmol/L   Potassium 3.4 (L) 3.5 - 5.1 mmol/L   Chloride 104 98 - 111 mmol/L   CO2 20 (L) 22 - 32 mmol/L   Glucose, Bld 81 70 - 99 mg/dL   BUN 9 6 - 20 mg/dL   Creatinine, Ser 6.21 0.44 - 1.00 mg/dL   Calcium 8.8 (L) 8.9 - 10.3 mg/dL   GFR, Estimated >30 >86 mL/min   Anion gap 11 5 - 15  CBC   Collection Time: 05/15/23 10:06 AM  Result Value Ref Range   WBC 3.7 (L) 4.0 - 10.5 K/uL   RBC 3.75 (L) 3.87 - 5.11 MIL/uL   Hemoglobin 11.9 (L) 12.0 - 15.0 g/dL   HCT 57.8 (L) 46.9 - 62.9 %   MCV 95.2 80.0 - 100.0 fL   MCH 31.7 26.0 - 34.0 pg   MCHC 33.3 30.0 - 36.0 g/dL   RDW 52.8 41.3 - 24.4 %   Platelets 210 150 - 400 K/uL   nRBC 0.0 0.0 - 0.2 %  Hepatic function panel   Collection Time: 05/15/23 10:06 AM  Result Value Ref Range   Total Protein 7.2 6.5 - 8.1 g/dL   Albumin  3.8 3.5 - 5.0 g/dL   AST 24 15 - 41 U/L   ALT 21 0 - 44 U/L   Alkaline Phosphatase 12 (L) 38 - 126 U/L   Total Bilirubin 0.8 <1.2 mg/dL   Bilirubin, Direct <0.1 0.0 - 0.2 mg/dL   Indirect Bilirubin NOT CALCULATED 0.3 - 0.9 mg/dL  POC urine preg, ED   Collection Time: 05/15/23 11:18 AM  Result Value Ref Range   Preg Test, Ur NEGATIVE NEGATIVE  Urinalysis, Routine w reflex microscopic -Urine, Clean Catch   Collection Time: 05/15/23 11:19 AM  Result Value Ref Range   Color, Urine YELLOW (A) YELLOW   APPearance CLEAR (A) CLEAR   Specific Gravity, Urine 1.013 1.005 - 1.030   pH 5.0 5.0 - 8.0   Glucose, UA NEGATIVE NEGATIVE mg/dL   Hgb urine dipstick LARGE (A) NEGATIVE   Bilirubin Urine NEGATIVE NEGATIVE   Ketones, ur 20 (A) NEGATIVE mg/dL   Protein, ur NEGATIVE NEGATIVE mg/dL   Nitrite NEGATIVE NEGATIVE   Leukocytes,Ua NEGATIVE NEGATIVE   RBC / HPF >50 0 - 5 RBC/hpf   WBC, UA 6-10 0 - 5 WBC/hpf   Bacteria, UA RARE (A) NONE SEEN   Squamous Epithelial / HPF 0-5 0 - 5 /HPF   Mucus PRESENT        Assessment & Plan:   Problem List Items Addressed This Visit   None Visit Diagnoses       Constipation, unspecified constipation type    -  Primary   Relevant Medications   Plecanatide (TRULANCE) 3 MG TABS     H/O gastric sleeve       Relevant Orders   CBC with Differential/Platelet   COMPLETE METABOLIC PANEL WITH GFR   B12 and Folate Panel   Iron, TIBC and Ferritin Panel   VITAMIN D 25 Hydroxy (Vit-D Deficiency, Fractures)   Vitamin B1   Zinc   Magnesium        Assessment and Plan    Post-bariatric surgery constipation Persistent despite over-the-counter laxatives, stool softeners, and dietary modifications. -Start trulance, trial for 30 days. if not  covered by insurance will try linzess -Consider increasing fiber intake, possibly with fiber gummies.  Nutritional deficiencies post-bariatric surgery Requested blood work to assess for deficiencies. -Order labs including  CBC, CMP, B12, folate, iron, vitamin D, and thiamine. -Will follow up with results when available, particularly delayed thiamine result.        Follow up plan: Return if symptoms worsen or fail to improve.

## 2023-08-05 ENCOUNTER — Encounter: Payer: Self-pay | Admitting: Nurse Practitioner

## 2023-08-05 ENCOUNTER — Other Ambulatory Visit: Payer: Self-pay | Admitting: Nurse Practitioner

## 2023-08-05 DIAGNOSIS — K5900 Constipation, unspecified: Secondary | ICD-10-CM

## 2023-08-05 MED ORDER — LINACLOTIDE 72 MCG PO CAPS: 72.0000 ug | ORAL_CAPSULE | Freq: Every day | ORAL | 0 refills | Status: DC

## 2023-08-08 LAB — CBC WITH DIFFERENTIAL/PLATELET
Absolute Lymphocytes: 2377 {cells}/uL (ref 850–3900)
Absolute Monocytes: 434 {cells}/uL (ref 200–950)
Basophils Absolute: 20 {cells}/uL (ref 0–200)
Basophils Relative: 0.4 %
Eosinophils Absolute: 82 {cells}/uL (ref 15–500)
Eosinophils Relative: 1.6 %
HCT: 36.4 % (ref 35.0–45.0)
Hemoglobin: 11.9 g/dL (ref 11.7–15.5)
MCH: 31.8 pg (ref 27.0–33.0)
MCHC: 32.7 g/dL (ref 32.0–36.0)
MCV: 97.3 fL (ref 80.0–100.0)
MPV: 12.8 fL — ABNORMAL HIGH (ref 7.5–12.5)
Monocytes Relative: 8.5 %
Neutro Abs: 2188 {cells}/uL (ref 1500–7800)
Neutrophils Relative %: 42.9 %
Platelets: 226 10*3/uL (ref 140–400)
RBC: 3.74 10*6/uL — ABNORMAL LOW (ref 3.80–5.10)
RDW: 12.7 % (ref 11.0–15.0)
Total Lymphocyte: 46.6 %
WBC: 5.1 10*3/uL (ref 3.8–10.8)

## 2023-08-08 LAB — ZINC: Zinc: 70 ug/dL (ref 60–130)

## 2023-08-08 LAB — COMPLETE METABOLIC PANEL WITH GFR
AG Ratio: 1.4 (calc) (ref 1.0–2.5)
ALT: 11 U/L (ref 6–29)
AST: 14 U/L (ref 10–30)
Albumin: 3.9 g/dL (ref 3.6–5.1)
Alkaline phosphatase (APISO): 11 U/L — ABNORMAL LOW (ref 31–125)
BUN: 12 mg/dL (ref 7–25)
CO2: 25 mmol/L (ref 20–32)
Calcium: 9.5 mg/dL (ref 8.6–10.2)
Chloride: 105 mmol/L (ref 98–110)
Creat: 0.85 mg/dL (ref 0.50–0.96)
Globulin: 2.7 g/dL (ref 1.9–3.7)
Glucose, Bld: 74 mg/dL (ref 65–99)
Potassium: 4.8 mmol/L (ref 3.5–5.3)
Sodium: 137 mmol/L (ref 135–146)
Total Bilirubin: 0.5 mg/dL (ref 0.2–1.2)
Total Protein: 6.6 g/dL (ref 6.1–8.1)
eGFR: 96 mL/min/{1.73_m2} (ref >=60)

## 2023-08-08 LAB — IRON,TIBC AND FERRITIN PANEL
%SAT: 32 % (ref 16–45)
Ferritin: 67 ng/mL (ref 16–154)
Iron: 74 ug/dL (ref 40–190)
TIBC: 229 ug/dL — ABNORMAL LOW (ref 250–450)

## 2023-08-08 LAB — B12 AND FOLATE PANEL
Folate: 19.5 ng/mL
Vitamin B-12: 385 pg/mL (ref 200–1100)

## 2023-08-08 LAB — VITAMIN D 25 HYDROXY (VIT D DEFICIENCY, FRACTURES): Vit D, 25-Hydroxy: 61 ng/mL (ref 30–100)

## 2023-08-08 LAB — VITAMIN B1: Vitamin B1 (Thiamine): 39 nmol/L — ABNORMAL HIGH (ref 8–30)

## 2023-08-08 LAB — MAGNESIUM: Magnesium: 2.1 mg/dL (ref 1.5–2.5)

## 2023-08-10 ENCOUNTER — Encounter: Payer: Self-pay | Admitting: Nurse Practitioner

## 2023-08-11 ENCOUNTER — Other Ambulatory Visit: Payer: Self-pay | Admitting: Nurse Practitioner

## 2023-08-11 DIAGNOSIS — R569 Unspecified convulsions: Secondary | ICD-10-CM

## 2023-08-31 ENCOUNTER — Other Ambulatory Visit: Payer: Self-pay | Admitting: Nurse Practitioner

## 2023-08-31 DIAGNOSIS — K5900 Constipation, unspecified: Secondary | ICD-10-CM

## 2023-09-01 MED ORDER — LINACLOTIDE 72 MCG PO CAPS: 72.0000 ug | ORAL_CAPSULE | Freq: Every day | ORAL | 1 refills | Status: DC

## 2023-10-03 ENCOUNTER — Ambulatory Visit: Admitting: Nurse Practitioner

## 2023-11-28 ENCOUNTER — Other Ambulatory Visit: Payer: Self-pay | Admitting: Nurse Practitioner

## 2023-11-28 DIAGNOSIS — K5900 Constipation, unspecified: Secondary | ICD-10-CM

## 2023-11-29 ENCOUNTER — Encounter: Payer: Self-pay | Admitting: Nurse Practitioner

## 2023-11-29 DIAGNOSIS — K5900 Constipation, unspecified: Secondary | ICD-10-CM

## 2023-12-02 NOTE — Telephone Encounter (Signed)
 Last OV 08/04/23.  Requested Prescriptions  Pending Prescriptions Disp Refills   linaclotide  (LINZESS ) 72 MCG capsule [Pharmacy Med Name: Linzess  72 MCG Oral Capsule] 90 capsule 0    Sig: TAKE 1 CAPSULE BY MOUTH ONCE DAILY BEFORE BREAKFAST     Gastroenterology: Irritable Bowel Syndrome Failed - 12/02/2023 12:02 PM      Failed - Valid encounter within last 12 months    Recent Outpatient Visits   None     Future Appointments             In 3 months Abram Hoguet, Monalisa Angles, FNP [facility], [facility]

## 2023-12-08 ENCOUNTER — Encounter: Admitting: Family Medicine

## 2024-03-01 ENCOUNTER — Encounter: Payer: Self-pay | Admitting: Nurse Practitioner

## 2024-03-10 ENCOUNTER — Encounter: Admitting: Nurse Practitioner

## 2024-03-11 ENCOUNTER — Ambulatory Visit (INDEPENDENT_AMBULATORY_CARE_PROVIDER_SITE_OTHER): Admitting: Nurse Practitioner

## 2024-03-11 VITALS — BP 112/72 | HR 100 | Temp 98.1°F | Resp 16 | Ht 66.0 in | Wt 229.6 lb

## 2024-03-11 DIAGNOSIS — M545 Low back pain, unspecified: Secondary | ICD-10-CM

## 2024-03-11 DIAGNOSIS — E66813 Obesity, class 3: Secondary | ICD-10-CM | POA: Diagnosis not present

## 2024-03-11 DIAGNOSIS — Z6841 Body Mass Index (BMI) 40.0 and over, adult: Secondary | ICD-10-CM

## 2024-03-11 DIAGNOSIS — E782 Mixed hyperlipidemia: Secondary | ICD-10-CM | POA: Diagnosis not present

## 2024-03-11 DIAGNOSIS — R569 Unspecified convulsions: Secondary | ICD-10-CM | POA: Diagnosis not present

## 2024-03-11 DIAGNOSIS — Z130 Encounter for screening for diseases of the blood and blood-forming organs and certain disorders involving the immune mechanism: Secondary | ICD-10-CM

## 2024-03-11 DIAGNOSIS — Z131 Encounter for screening for diabetes mellitus: Secondary | ICD-10-CM

## 2024-03-11 LAB — POCT URINALYSIS DIPSTICK
Bilirubin, UA: NEGATIVE
Blood, UA: NEGATIVE
Glucose, UA: NEGATIVE
Ketones, UA: NEGATIVE
Leukocytes, UA: NEGATIVE
Nitrite, UA: NEGATIVE
Odor: NORMAL
Protein, UA: NEGATIVE
Spec Grav, UA: 1.025 (ref 1.010–1.025)
Urobilinogen, UA: 0.2 U/dL
pH, UA: 5.5 (ref 5.0–8.0)

## 2024-03-11 MED ORDER — TIZANIDINE HCL 4 MG PO TABS: 4.0000 mg | ORAL_TABLET | Freq: Three times a day (TID) | ORAL | 0 refills | Status: AC | PRN

## 2024-03-11 NOTE — Progress Notes (Signed)
 BP 112/72   Pulse 100   Temp 98.1 F (36.7 C)   Resp 16   Ht 5' 6 (1.676 m)   Wt 229 lb 9.6 oz (104.1 kg)   LMP 02/29/2024   SpO2 99%   BMI 37.06 kg/m    Subjective:    Patient ID: Julia Sullivan, female    DOB: 1996/06/02, 28 y.o.   MRN: [id number]  HPI: Julia Sullivan is a 28 y.o. female  Chief Complaint  Patient presents with   Back Pain    Flank on left started today   Seizures    2 since aug. Wants referral to neurology for 2nd opinion    Discussed the use of AI scribe software for clinical note transcription with the patient, who gave verbal consent to proceed.  History of Present Illness Julia Sullivan is a 28 year old female who presents with recurrent seizure-like activity.  Seizure-like activity - Recurrent seizure-like episodes since August 2025, initially observed by her boyfriend - Episodes primarily occur during sleep - Frequently awakens with headache - On February 29, 2024, awoke with tongue swelling and blood on her pillow, accompanied by headache - Similar event in October 2024 during hospitalization - First known seizure in June 2024, found on bathroom floor by her mother - Concerned as she believed the condition was previously resolved - No new medications started - No family history of seizures - Previous neurology evaluation in 2024 with MRI and EEG reported as clear  Headache - Wakes up with headache following seizure-like episodes - Headache present with tongue swelling and blood on pillow on February 29, 2024  Tongue swelling and oral trauma - Awoke on February 29, 2024, with tongue swelling and blood on pillow  Muscle spasm - Muscle spasm in left lower back began morning of visit - Lingering sensation without radiation, numbness, or tingling - No urinary symptoms - No prior use of muscle relaxants, open to trying if needed  Metabolic and weight concerns - History of hyperlipidemia and obesity - Current weight 229 pounds - BMI  37.06  Flowsheet Row Office Visit from 03/11/2024 in [facility]  1 39 inches     BRAIN MRI WITHOUT AND WITH CONTRAST   INDICATION: 3T with epilepsy protocol.   COMPARISON: None   TECHNIQUE: Multiplanar, multisequence MR imaging of the brain was obtained before and after intravenous administration of contrast. Contrast: 12 mL GADOBUTROL 1 MMOL/ML IV SOLN.   FINDINGS:   Skull/marrow/soft tissues: No focal marrow-replacing lesion. Mastoid air cells are clear.   Orbits: No mass.   Paranasal sinuses: No substantial mucosal disease.   Brain: Normal and symmetric appearance of the hippocampi. No mass lesions identified. No acute infarct. No mass effect. No hydrocephalus. No acute hemorrhage. No significant white matter disease for the patient's age. Normal appearance of the major intracranial arteries and dural venous sinuses. No abnormal enhancement.     IMPRESSION:   1. No epileptogenic structural abnormality identified.  2.  No acute intracranial abnormality.      03/11/2024    2:41 PM 08/04/2023    9:30 AM 02/26/2023    2:09 PM  Depression screen PHQ 2/9  Decreased Interest 0 0 0  Down, Depressed, Hopeless 0 0 0  PHQ - 2 Score 0 0 0  Altered sleeping 0 0 0  Tired, decreased energy 0 0 0  Change in appetite 0 0 0  Feeling bad or failure about yourself  0 0 0  Trouble  concentrating 0 0 0  Moving slowly or fidgety/restless 0 0 0  Suicidal thoughts 0 0 0  PHQ-9 Score 0 0 0  Difficult doing work/chores Not difficult at all Not difficult at all Not difficult at all    Relevant past medical, surgical, family and social history reviewed and updated as indicated. Interim medical history since our last visit reviewed. Allergies and medications reviewed and updated.  Review of Systems  Ten systems reviewed and is negative except as mentioned in HPI      Objective:      BP 112/72   Pulse 100   Temp 98.1 F (36.7 C)   Resp 16   Ht 5' 6 (1.676  m)   Wt 229 lb 9.6 oz (104.1 kg)   LMP 02/29/2024   SpO2 99%   BMI 37.06 kg/m    Wt Readings from Last 3 Encounters:  03/11/24 229 lb 9.6 oz (104.1 kg)  08/04/23 256 lb (116.1 kg)  05/15/23 277 lb (125.6 kg)    Physical Exam VITALS: BP- 112/82 MEASUREMENTS: Weight- 229, BMI- 37.06. GENERAL: Alert, cooperative, well developed, no acute distress HEENT: Normocephalic, normal oropharynx, moist mucous membranes CHEST: Clear to auscultation bilaterally, No wheezes, rhonchi, or crackles CARDIOVASCULAR: Normal heart rate and rhythm, S1 and S2 normal without murmurs ABDOMEN: Soft, non-tender, non-distended, without organomegaly, Normal bowel sounds EXTREMITIES: No cyanosis or edema NEUROLOGICAL: Cranial nerves grossly intact, Moves all extremities without gross motor or sensory deficit  Results for orders placed or performed in visit on 03/11/24  POCT Urinalysis Dipstick   Collection Time: 03/11/24  3:01 PM  Result Value Ref Range   Color, UA yellow    Clarity, UA clear    Glucose, UA Negative Negative   Bilirubin, UA neg    Ketones, UA neg    Spec Grav, UA 1.025 1.010 - 1.025   Blood, UA neg    pH, UA 5.5 5.0 - 8.0   Protein, UA Negative Negative   Urobilinogen, UA 0.2 0.2 or 1.0 E.U./dL   Nitrite, UA neg    Leukocytes, UA Negative Negative   Appearance clear    Odor normal           Assessment & Plan:   Problem List Items Addressed This Visit       Other   Class 3 severe obesity due to excess calories without serious comorbidity with body mass index (BMI) of 45.0 to 49.9 in adult   Mixed hyperlipidemia   Relevant Orders   Lipid panel   Other Visit Diagnoses       Seizure-like activity (HCC)    -  Primary   Relevant Orders   CBC with Differential/Platelet   Comprehensive metabolic panel with GFR   TSH   B12 and Folate Panel   Ambulatory referral to Neurology     Screening for deficiency anemia       Relevant Orders   CBC with Differential/Platelet      Screening for diabetes mellitus       Relevant Orders   Comprehensive metabolic panel with GFR   Hemoglobin A1c     Acute left-sided low back pain without sciatica       Relevant Medications   tiZANidine  (ZANAFLEX ) 4 MG tablet   Other Relevant Orders   POCT Urinalysis Dipstick (Completed)        Assessment and Plan Assessment & Plan Seizure disorder Recurrent seizure-like activity with recent episodes in August 2025. Previous MRI and EEG were clear. Seizures  occur during sleep, with postictal symptoms including tongue biting and headaches. Seeking a second opinion from a different neurology provider. - Refer to neurology for a second opinion - Order labs to check for any changes that might have caused seizures  Low back pain Acute onset of left lower back pain, described as muscle spasm, starting this morning. Associated numbness, tingling, or urinary symptoms not reported. Likely musculoskeletal in nature. - Prescribe tizanidine  as needed for muscle spasm - Provide printout of stretches for back pain - Advise on strength training exercises for back pain  Obesity, class 3 BMI of 37.06, indicating class 3 obesity. History of weight loss surgery. - Encourage continuation of lifestyle modifications, including dietary management and regular exercise. -continue to increase physical activity, getting at least 150 min of physical activity a week.  Work on including Runner, broadcasting/film/video 2 days a week.  - continue eating at a calorie deficit 1600-1700 cal a day, eating a well balanced diet with whole foods, avoiding processed foods.   Patient is motivated to continue working on lifestyle modification.    Hyperlipidemia Hyperlipidemia. Check labs  Follow-Up - Advise to follow up with neurology and report back on findings - Instruct to return for lab work at her convenience        Follow up plan: Return in about 6 months (around 09/08/2024) for follow up.

## 2024-04-02 ENCOUNTER — Ambulatory Visit: Admitting: Nurse Practitioner

## 2024-05-16 ENCOUNTER — Other Ambulatory Visit: Payer: Self-pay | Admitting: Nurse Practitioner

## 2024-05-16 DIAGNOSIS — K5900 Constipation, unspecified: Secondary | ICD-10-CM

## 2024-05-17 NOTE — Telephone Encounter (Signed)
 Requested Prescriptions  Pending Prescriptions Disp Refills   LINZESS  72 MCG capsule [Pharmacy Med Name: Linzess  72 MCG Oral Capsule] 90 capsule 0    Sig: TAKE 1 CAPSULE BY MOUTH ONCE DAILY BEFORE BREAKFAST     Gastroenterology: Irritable Bowel Syndrome Passed - 05/17/2024  5:21 PM      Passed - Valid encounter within last 12 months    Recent Outpatient Visits           2 months ago Seizure-like activity [facility])   [facility] Gareth Mliss FALCON, FNP       Future Appointments             In 3 months Gareth, Mliss FALCON, FNP [facility] [place]

## 2024-05-22 ENCOUNTER — Encounter: Payer: Self-pay | Admitting: Nurse Practitioner

## 2024-06-10 ENCOUNTER — Ambulatory Visit: Admitting: Nurse Practitioner

## 2024-06-19 ENCOUNTER — Emergency Department
Admission: EM | Admit: 2024-06-19 | Discharge: 2024-06-19 | Disposition: A | Discharge status: Home / Self Care | Attending: Emergency Medicine | Admitting: Emergency Medicine

## 2024-06-19 ENCOUNTER — Other Ambulatory Visit: Payer: Self-pay

## 2024-06-19 DIAGNOSIS — H109 Unspecified conjunctivitis: Secondary | ICD-10-CM | POA: Insufficient documentation

## 2024-06-19 MED ORDER — POLYMYXIN B-TRIMETHOPRIM 10000-0.1 UNIT/ML-% OP SOLN: 1.0000 [drp] | Freq: Four times a day (QID) | OPHTHALMIC | 0 refills | Status: AC

## 2024-06-19 NOTE — ED Triage Notes (Signed)
 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

## 2024-06-19 NOTE — ED Provider Notes (Signed)
 [facility] Provider Note    Event Date/Time   First MD Initiated Contact with Patient 06/19/24 1920     (approximate)   History   Conjunctivitis   HPI  Julia Sullivan is a 28 y.o. female with PMH of seizure, anxiety and depression who presents for evaluation of conjunctivitis.  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  This morning she woke up with some redness and irritation to her right eye.  She also reports significant mucus drainage from the eye that was green in color.  Her eye was actually crusted shut when she woke up.  No vision changes, blurry vision or pain with eye movements.      Physical Exam   Triage Vital Signs: ED Triage Vitals  Encounter Vitals Group     BP 06/19/24 1814 124/89     Girls Systolic BP Percentile --      Girls Diastolic BP Percentile --      Boys Systolic BP Percentile --      Boys Diastolic BP Percentile --      Pulse Rate 06/19/24 1814 79     Resp 06/19/24 1814 16     Temp 06/19/24 1814 98.9 F (37.2 C)     Temp Source 06/19/24 1814 Oral     SpO2 06/19/24 1814 100 %     Weight 06/19/24 1819 224 lb (101.6 kg)     Height 06/19/24 1819 5' 6 (1.676 m)     Head Circumference --      Peak Flow --      Pain Score 06/19/24 1818 1     Pain Loc --      Pain Education --      Exclude from Growth Chart --     Most recent vital signs: Vitals:   06/19/24 1814  BP: 124/89  Pulse: 79  Resp: 16  Temp: 98.9 F (37.2 C)  SpO2: 100%   General: Awake, no distress.  CV:  Good peripheral perfusion.  RRR. Resp:  Normal effort.  CTAB Abd:  No distention.  Other:  Right eye conjunctive is injected, no drainage noted at this time, PERRL, EOM intact and does not elicit pain   ED Results / Procedures / Treatments   Labs (all labs ordered are listed, but only abnormal results are displayed) Labs Reviewed - No data to display    PROCEDURES:  Critical Care performed:  No  Procedures   MEDICATIONS ORDERED IN ED: Medications - No data to display   IMPRESSION / MDM / ASSESSMENT AND PLAN / ED COURSE  I reviewed the triage vital signs and the nursing notes.                             28 year old female presents for evaluation of conjunctivitis.  Vital signs are stable and patient is NAD on exam.  Differential diagnosis includes, but is not limited to, viral conjunctivitis, bacterial conjunctivitis, blepharitis, corneal abrasion.  Patient's presentation is most consistent with acute, uncomplicated illness.  Patient's right conjunctiva is injected.  She denied eye pain and foreign body sensation.  No pain with eye movements.  I explained to the patient that her symptoms may be due to a virus or bacteria.  Will treat with antibiotic eyedrops so that she is able to return to work.  Did give precautions about changing her pillowcase, not using eye make-up.  Patient  does not wear contacts.  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  Patient voiced understanding, all questions were answered and she was stable at discharge.      FINAL CLINICAL IMPRESSION(S) / ED DIAGNOSES   Final diagnoses:  Conjunctivitis of right eye, unspecified conjunctivitis type     Rx / DC Orders   ED Discharge Orders          Ordered    trimethoprim -polymyxin b  (POLYTRIM ) ophthalmic solution  Every 6 hours        06/19/24 2014             Note:  This document was prepared using Dragon voice recognition software and may include unintentional dictation errors.   Cleaster Tinnie LABOR, PA-C 06/19/24 2018    Jacolyn Pae, MD 06/20/24 [phone]

## 2024-06-19 NOTE — Discharge Instructions (Addendum)
 The infection in your eye may be caused by a bacteria or a virus.  I did send antibiotic eyedrops for you to use.  Please use them as prescribed.  Try to avoid touching your eyes as this infection is quite contagious.  If you notice redness developing in your left eye you can use the same eyedrops in the eye as well.  I also recommend changing your pillowcase daily or putting a towel down.  Do not use eye make-up until this has fully resolved.  You should begin to have some improvement in about 2 days.  If things are not getting better please return to the emergency department or follow-up with the ophthalmologist whose information is attached.  For immune support, I do recommend taking supplements.  My personal favorite products are AG1 green powder which can be bought online or at [employer].  I also like VIBE ORIGINAL Immune Health Liquid Multi by [facility]. Mix 30 mL of this in your water and drink daily for immune support.

## 2024-06-25 ENCOUNTER — Ambulatory Visit: Admitting: Nurse Practitioner

## 2024-06-30 ENCOUNTER — Ambulatory Visit: Admitting: Nurse Practitioner

## 2024-07-06 ENCOUNTER — Ambulatory Visit: Admitting: Nurse Practitioner

## 2024-07-27 ENCOUNTER — Ambulatory Visit: Admitting: Nurse Practitioner

## 2024-08-02 ENCOUNTER — Ambulatory Visit: Admitting: Nurse Practitioner

## 2024-08-11 ENCOUNTER — Ambulatory Visit: Admitting: Nurse Practitioner

## 2024-08-27 ENCOUNTER — Encounter: Admitting: Family Medicine

## 2024-09-10 ENCOUNTER — Ambulatory Visit: Admitting: Nurse Practitioner

## 2025-03-16 ENCOUNTER — Encounter: Admitting: Nurse Practitioner
